# Patient Record
Sex: Male | Born: 1975
Health system: Southern US, Community
[De-identification: ages and names within clinical notes are randomized; demographics above are authoritative.]

## PROBLEM LIST (undated history)

## (undated) DIAGNOSIS — K219 Gastro-esophageal reflux disease without esophagitis: Secondary | ICD-10-CM

## (undated) DIAGNOSIS — T8859XA Other complications of anesthesia, initial encounter: Secondary | ICD-10-CM

## (undated) DIAGNOSIS — G4730 Sleep apnea, unspecified: Secondary | ICD-10-CM

## (undated) DIAGNOSIS — T4145XA Adverse effect of unspecified anesthetic, initial encounter: Secondary | ICD-10-CM

## (undated) HISTORY — PX: TENDON REPAIR: SHX5111

## (undated) HISTORY — PX: OTHER SURGICAL HISTORY: SHX169

## (undated) HISTORY — DX: Gastro-esophageal reflux disease without esophagitis: K21.9

---

## 2002-10-02 ENCOUNTER — Ambulatory Visit (HOSPITAL_BASED_OUTPATIENT_CLINIC_OR_DEPARTMENT_OTHER): Admission: RE | Admit: 2002-10-02 | Discharge: 2002-10-02 | Payer: Self-pay | Admitting: Orthopedic Surgery

## 2004-09-29 ENCOUNTER — Emergency Department (HOSPITAL_COMMUNITY): Admission: EM | Admit: 2004-09-29 | Discharge: 2004-09-29 | Payer: Self-pay | Admitting: Emergency Medicine

## 2004-10-13 ENCOUNTER — Ambulatory Visit (HOSPITAL_COMMUNITY): Admission: RE | Admit: 2004-10-13 | Discharge: 2004-10-13 | Payer: Self-pay | Admitting: Orthopedic Surgery

## 2004-10-13 ENCOUNTER — Ambulatory Visit (HOSPITAL_BASED_OUTPATIENT_CLINIC_OR_DEPARTMENT_OTHER): Admission: RE | Admit: 2004-10-13 | Discharge: 2004-10-13 | Payer: Self-pay | Admitting: Orthopedic Surgery

## 2009-12-22 ENCOUNTER — Ambulatory Visit (HOSPITAL_COMMUNITY): Admission: AD | Admit: 2009-12-22 | Discharge: 2009-12-22 | Payer: Self-pay | Admitting: Gastroenterology

## 2010-07-02 NOTE — Op Note (Signed)
[name], [name]                         ACCOUNT NO.:  [id number]   MEDICAL RECORD NO.:  [id number]                   PATIENT TYPE:  AMB   LOCATION:  DSC                                  FACILITY:  [facility]   PHYSICIAN:  Feliberto Gottron. Turner Daniels, M.D.                DATE OF BIRTH:  November 27, 1975   DATE OF PROCEDURE:  10/02/2002  DATE OF DISCHARGE:                                 OPERATIVE REPORT   PREOPERATIVE DIAGNOSES:  Right knee medial meniscal tear.   POSTOPERATIVE DIAGNOSES:  Right knee medial meniscal tear.   OPERATION PERFORMED:  Right knee partial arthroscopic medial meniscectomy.   SURGEON:  Feliberto Gottron. Turner Daniels, M.D.   ASSISTANTLaural Benes. Jannet Mantis.   ANESTHESIA:  Local converted to general LMA.   ESTIMATED BLOOD LOSS:  Minimal.   FLUIDS REPLACED:  crystalloids.   DRAINS:  None.   TOURNIQUET TIME:  None.   INDICATIONS FOR PROCEDURE:  The patient is a 35 year old man with  symptomatic medial meniscal tear of the right knee, MRI proven, who was  taken for arthroscopic decompression of his right knee to decrease pain and  increase function, having failed conservative treatment with anti-  inflammatory medicines, observation and exercise.   DESCRIPTION OF PROCEDURE:  The patient was identified by arm band and take  to the operating room at [facility].  Appropriate  anesthetic monitors were attached.  Local anesthesia was induced to the  right knee.  Because of persistent pain and anxiety, this was converted to  general LMA anesthesia.  A lateral post was applied to the table and the  right lower extremity prepped and draped in the usual sterile fashion from  the ankle to the midthigh.  Using a #11 blade, standard inferomedial and  inferolateral peripatellar portals were then made allowing introduction of  the arthroscope to the inferolateral portal and the outflow through the  inferomedial portal.  The patella, trochlea and suprapatellar pouch were  in  excellent condition. There were no loose bodies in the joint fluid.  On the  medial compartment, he had a double parrot beak T-type tear, probably a  bucket handle that had torn in the middle and this was debrided back to  stable margins from the posterior horn to the medial horn with a 3.5 gator  sucker shaver. We then thoroughly probed the remaining meniscus and found it  to be stable.  There was a small horizontal cleavage tear that was debrided  back to stable margins as well.  In the notch, the ACL and the PCL were  noted to be intact.  The lateral compartment was pristine.  The gutters were  cleared medially and laterally.  The arthroscope was then inserted medial to  the PCL clearing the posterior compartment as well.  The knee was thoroughly  irrigated with normal saline solution using the arthroscope.  The  instruments  were removed.  A dressing  of Xeroform, 4 x 4 dressing sponges, Webril and Ace wrap was applied.  The  patient was awakened and taken to the recovery room without difficulty.                                                Feliberto Gottron. Turner Daniels, M.D.    Ovid Curd  D:  10/02/2002  T:  10/03/2002  Job:  [id number]

## 2010-07-02 NOTE — Op Note (Signed)
[name], Gregory Lyons             ACCOUNT NO.:  [id number]   MEDICAL RECORD NO.:  [id number]          PATIENT TYPE:  AMB   LOCATION:  DSC                          FACILITY:  [facility]   PHYSICIAN:  Artist Pais. Weingold, M.D.DATE OF BIRTH:  04-11-1975   DATE OF PROCEDURE:  10/13/2004  DATE OF DISCHARGE:                                 OPERATIVE REPORT   PREOPERATIVE DIAGNOSIS:  Left thumb ulnar collateral ligament tear at the  metacarpophalangeal joint.   POSTOPERATIVE DIAGNOSIS:  Left thumb ulnar collateral ligament tear at the  metacarpophalangeal joint.   PROCEDURE:  Repair, left thumb metacarpophalangeal joint ulnar collateral  ligament.   SURGEON:  Artist Pais. Mina Marble, M.D.   ASSISTANT:  Aura Fey. Bobbe Medico.   ANESTHESIA:  General.   TOURNIQUET TIME:  35 minutes.   No complications, no drains.   OPERATIVE REPORT:  The patient was taken to the operating room, where after  the induction of adequate general anesthesia and axillary block analgesia,  the left upper extremity was prepped and draped in the usual sterile  fashion.  An Esmarch was used to exsanguinate the limb.  The tourniquet was  then inflated to 250 mmHg.  At this point in time a C-shaped incision was  made over the ulnar side of the metacarpophalangeal joint of the left thumb  and a large dorsally-based flap was elevated.  The adductor aponeurosis was  longitudinally split and flaps were raised both dorsally and volarly.  The  extensor mechanism was retracted dorsally.  The capsule on the dorsal ulnar  aspect of the metacarpophalangeal joint was incised.  There was a complete  tear of the collateral ligament off the proximal phalangeal base insertion.  The proximal phalangeal base insertion was decorticated down to good  bleeding bone.  The collateral ligament was sutured in a Kessler-type suture  with a 2-0 double-armed Prolene and advanced into the bony trough created at  the base of the proximal phalanx on the  ulnar side using a Keith needle and  pulled over to the radial side through the skin and tied over a well-padded  button.  At this point in time the wound was irrigated.  The capsule was  closed with 2-0 Fibrewire in horizontal mattress sutures, followed by the  aponeurosis with a 4-0 Mersilene, the skin with running 3-0 Prolene  subcuticular stitch.  Steri-Strips, 4 x 4's, fluffs and a radial dorsal  splint was applied.  The patient tolerated the procedure well and __________  in stable fashion.      Artist Pais Mina Marble, M.D.  Electronically Signed     MAW/MEDQ  D:  10/13/2004  T:  10/13/2004  Job:  [id number]

## 2013-05-14 ENCOUNTER — Ambulatory Visit (INDEPENDENT_AMBULATORY_CARE_PROVIDER_SITE_OTHER): Payer: Federal, State, Local not specified - PPO | Admitting: Family Medicine

## 2013-05-14 ENCOUNTER — Encounter: Payer: Self-pay | Admitting: Family Medicine

## 2013-05-14 VITALS — BP 110/78 | HR 74 | Temp 97.5°F | Resp 16 | Ht 72.0 in | Wt 235.0 lb

## 2013-05-14 DIAGNOSIS — R5381 Other malaise: Secondary | ICD-10-CM

## 2013-05-14 DIAGNOSIS — R5383 Other fatigue: Principal | ICD-10-CM

## 2013-05-14 DIAGNOSIS — G4710 Hypersomnia, unspecified: Secondary | ICD-10-CM

## 2013-05-14 LAB — LIPID PANEL
CHOL/HDL RATIO: 4.4 ratio
Cholesterol: 188 mg/dL (ref 0–200)
HDL: 43 mg/dL (ref >39)
LDL CALC: 121 mg/dL — AB (ref 0–99)
Triglycerides: 118 mg/dL (ref <150)
VLDL: 24 mg/dL (ref 0–40)

## 2013-05-14 LAB — COMPLETE METABOLIC PANEL WITH GFR
ALT: 23 U/L (ref 0–53)
AST: 19 U/L (ref 0–37)
Albumin: 4.5 g/dL (ref 3.5–5.2)
Alkaline Phosphatase: 48 U/L (ref 39–117)
BUN: 12 mg/dL (ref 6–23)
CALCIUM: 9 mg/dL (ref 8.4–10.5)
CHLORIDE: 102 meq/L (ref 96–112)
CO2: 29 mEq/L (ref 19–32)
CREATININE: 1.1 mg/dL (ref 0.50–1.35)
GFR, Est African American: >89 mL/min
GFR, Est Non African American: 84 mL/min
Glucose, Bld: 91 mg/dL (ref 70–99)
Potassium: 4.2 mEq/L (ref 3.5–5.3)
SODIUM: 139 meq/L (ref 135–145)
Total Bilirubin: 0.7 mg/dL (ref 0.2–1.2)
Total Protein: 7 g/dL (ref 6.0–8.3)

## 2013-05-14 LAB — CBC WITH DIFFERENTIAL/PLATELET
Basophils Absolute: 0.1 10*3/uL (ref 0.0–0.1)
Basophils Relative: 1 % (ref 0–1)
EOS ABS: 0.4 10*3/uL (ref 0.0–0.7)
EOS PCT: 7 % — AB (ref 0–5)
HCT: 44.9 % (ref 39.0–52.0)
HEMOGLOBIN: 15.6 g/dL (ref 13.0–17.0)
LYMPHS ABS: 2 10*3/uL (ref 0.7–4.0)
Lymphocytes Relative: 33 % (ref 12–46)
MCH: 30.4 pg (ref 26.0–34.0)
MCHC: 34.7 g/dL (ref 30.0–36.0)
MCV: 87.4 fL (ref 78.0–100.0)
MONOS PCT: 9 % (ref 3–12)
Monocytes Absolute: 0.5 10*3/uL (ref 0.1–1.0)
Neutro Abs: 3 10*3/uL (ref 1.7–7.7)
Neutrophils Relative %: 50 % (ref 43–77)
PLATELETS: 222 10*3/uL (ref 150–400)
RBC: 5.14 MIL/uL (ref 4.22–5.81)
RDW: 12.8 % (ref 11.5–15.5)
WBC: 6 10*3/uL (ref 4.0–10.5)

## 2013-05-14 LAB — TSH: TSH: 1.588 u[IU]/mL (ref 0.350–4.500)

## 2013-05-14 NOTE — Progress Notes (Signed)
   Subjective:    Patient ID: Gregory Lyons, male    DOB: 12/25/1975, 38 y.o.   MRN: [id number]  HPI Patient is here today complaining of fatigue and hypersomnia. He can fall asleep easily throughout the day. He feels tired when driving. He denies an asleep when driving. He states that he could easily call asleep after eating, while sitting still, while reading.  He snores loudly every night. His wife has observed apneic episodes.  . Like to be evaluated for sleep apnea. He is also overdue for fasting lab work. No past medical history on file. No current outpatient prescriptions on file prior to visit.   No current facility-administered medications on file prior to visit.   No Known Allergies History   Social History  . Marital Status: Married    Spouse Name: N/A    Number of Children: N/A  . Years of Education: N/A   Occupational History  . Not on file.   Social History Main Topics  . Smoking status: Never Smoker   . Smokeless tobacco: Not on file  . Alcohol Use: Not on file  . Drug Use: Not on file  . Sexual Activity: Not on file   Other Topics Concern  . Not on file   Social History Narrative  . No narrative on file      Review of Systems  All other systems reviewed and are negative.       Objective:   Physical Exam  Vitals reviewed. Neck: Neck supple. No thyromegaly present.  Cardiovascular: Normal rate, regular rhythm and normal heart sounds.   Pulmonary/Chest: Effort normal and breath sounds normal. No respiratory distress. He has no wheezes. He has no rales.  Abdominal: Soft. Bowel sounds are normal. He exhibits no distension. There is no tenderness. There is no rebound and no guarding.  Lymphadenopathy:    He has no cervical adenopathy.    Patient does have a larger neck. He also has +1 tonsils. He has a large uvula      Assessment & Plan:  1. Other malaise and fatigue - COMPLETE METABOLIC PANEL WITH GFR - CBC with Differential - Lipid  panel - TSH  2. Hypersomnia I will schedule patient for a split-level sleep study to evaluate for obstructive sleep apnea.

## 2013-05-15 ENCOUNTER — Other Ambulatory Visit: Payer: Self-pay | Admitting: Family Medicine

## 2013-05-15 DIAGNOSIS — R5381 Other malaise: Secondary | ICD-10-CM

## 2013-05-15 DIAGNOSIS — G4710 Hypersomnia, unspecified: Secondary | ICD-10-CM

## 2013-05-15 DIAGNOSIS — R5383 Other fatigue: Secondary | ICD-10-CM

## 2013-05-28 ENCOUNTER — Ambulatory Visit: Payer: Federal, State, Local not specified - PPO | Attending: Family Medicine | Admitting: Sleep Medicine

## 2013-05-28 ENCOUNTER — Encounter: Payer: Self-pay | Admitting: Neurology

## 2013-05-28 DIAGNOSIS — Z6832 Body mass index (BMI) 32.0-32.9, adult: Secondary | ICD-10-CM | POA: Insufficient documentation

## 2013-05-28 DIAGNOSIS — R5381 Other malaise: Secondary | ICD-10-CM

## 2013-05-28 DIAGNOSIS — R5383 Other fatigue: Secondary | ICD-10-CM

## 2013-05-28 DIAGNOSIS — G4733 Obstructive sleep apnea (adult) (pediatric): Secondary | ICD-10-CM | POA: Insufficient documentation

## 2013-05-28 DIAGNOSIS — G4710 Hypersomnia, unspecified: Secondary | ICD-10-CM

## 2013-05-29 NOTE — Sleep Study (Signed)
Split night protocol not met due to AHI <15 in 1st 2 hrs TST; however in later part of study, past lab cut off time,  severe desaturations noted and overall AHI was >15

## 2013-05-30 ENCOUNTER — Encounter (INDEPENDENT_AMBULATORY_CARE_PROVIDER_SITE_OTHER): Payer: Self-pay

## 2013-05-30 NOTE — Sleep Study (Signed)
  [facility] Dilana Mcphie A. Gerilyn Pilgrimoonquah, MD     [email or web address]          LOCATION: SLEEP LAB FACILITY: [facility]  PHYSICIAN: Caily Rakers A. Gerilyn Pilgrimoonquah, M.D.   DATE OF STUDY: 05/28/2013  NOCTURNAL POLYSOMNOGRAM   REFERRING PHYSICIAN: Lynnea FerrierWarren Pickard.  INDICATIONS: This is a 38 year old man who presents with hypersomnia, fatigue and snoring.  MEDICATIONS:  Prior to Admission medications   Not on File      EPWORTH SLEEPINESS SCALE: 11.   BMI: 32.   ARCHITECTURAL SUMMARY: Total recording time was 361 minutes. Sleep efficiency 93 %. Sleep latency 8 minutes. REM latency 65 minutes. Stage NI 5 %, N2 63 % and N3 2 % and REM sleep 29 %.    RESPIRATORY DATA:  Baseline oxygen saturation is 96 %. The lowest saturation is 61 %. The diagnostic AHI is 29. The RDI is 29. The REM AHI is 50. The obstructive events occurred exclusively during the supine position. The supine index is 62. The events occurred during the last one third of the night and therefore the patient cannot be titrated.  LIMB MOVEMENT SUMMARY: PLM index 43.   ELECTROCARDIOGRAM SUMMARY: Average heart rate is 65 with no significant dysrhythmias observed.   IMPRESSION:  1. Moderately severe supine-related obstructive sleep apnea syndrome. Auto Pap between pressures of 8-20 is suggested. If is not beneficial he may need a formal CPAP titration study. Also consider positional therapy. 2. Severe periodic limb movement disorder of sleep. Consider dopamine agonist such as Mirapex or Requip.  Thanks for this referral.  Keyonte Cookston A. Gerilyn Pilgrimoonquah, M.D. Diplomat, Biomedical engineerAmerican Board of Sleep Medicine.

## 2013-10-14 ENCOUNTER — Other Ambulatory Visit: Payer: Self-pay | Admitting: Family Medicine

## 2013-10-14 DIAGNOSIS — L0291 Cutaneous abscess, unspecified: Secondary | ICD-10-CM

## 2013-10-14 DIAGNOSIS — L0390 Cellulitis, unspecified: Principal | ICD-10-CM

## 2013-10-14 MED ORDER — SULFAMETHOXAZOLE-TMP DS 800-160 MG PO TABS: 1.0000 | ORAL_TABLET | Freq: Two times a day (BID) | ORAL | Status: DC

## 2013-10-18 LAB — CULTURE, ROUTINE-ABSCESS
Gram Stain: NONE SEEN
Organism ID, Bacteria: NO GROWTH

## 2013-12-06 ENCOUNTER — Encounter: Payer: Self-pay | Admitting: Family Medicine

## 2013-12-06 ENCOUNTER — Ambulatory Visit (INDEPENDENT_AMBULATORY_CARE_PROVIDER_SITE_OTHER): Payer: Federal, State, Local not specified - PPO | Admitting: Family Medicine

## 2013-12-06 VITALS — BP 120/80 | HR 72 | Temp 98.7°F | Resp 18

## 2013-12-06 DIAGNOSIS — M25561 Pain in right knee: Secondary | ICD-10-CM
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
## 2013-12-09 ENCOUNTER — Ambulatory Visit: Payer: Federal, State, Local not specified - PPO | Admitting: Family Medicine

## 2013-12-10 ENCOUNTER — Telehealth: Payer: Self-pay | Admitting: *Deleted

## 2013-12-10 DIAGNOSIS — M25562 Pain in left knee: Secondary | ICD-10-CM

## 2013-12-10 NOTE — Telephone Encounter (Signed)
Referral place to ortho

## 2013-12-12 ENCOUNTER — Encounter: Payer: Self-pay | Admitting: *Deleted

## 2013-12-19 ENCOUNTER — Telehealth: Payer: Self-pay | Admitting: *Deleted

## 2013-12-19 MED ORDER — KETOROLAC TROMETHAMINE 10 MG PO TABS
10.0000 mg | ORAL_TABLET | Freq: Four times a day (QID) | ORAL | Status: DC | PRN
Start: 2013-12-19 — End: 2014-03-10

## 2013-12-19 NOTE — Telephone Encounter (Signed)
Received call from patient wife.   Reports that patient continues to have increased pain to R knee.   MD made aware and new orders obtained for Toradol.   Prescription sent to pharmacy.

## 2014-01-08 ENCOUNTER — Ambulatory Visit (HOSPITAL_COMMUNITY)
Admission: RE | Admit: 2014-01-08 | Discharge: 2014-01-08 | Disposition: A | Discharge status: Home / Self Care | Payer: Federal, State, Local not specified - PPO | Source: Ambulatory Visit | Attending: Orthopedic Surgery | Admitting: Orthopedic Surgery

## 2014-01-08 ENCOUNTER — Other Ambulatory Visit (HOSPITAL_COMMUNITY): Payer: Self-pay | Admitting: Orthopedic Surgery

## 2014-01-08 DIAGNOSIS — Z01818 Encounter for other preprocedural examination: Secondary | ICD-10-CM | POA: Insufficient documentation

## 2014-01-08 DIAGNOSIS — M25561 Pain in right knee: Secondary | ICD-10-CM

## 2014-02-25 ENCOUNTER — Ambulatory Visit: Payer: Self-pay | Admitting: Orthopedic Surgery

## 2014-02-25 NOTE — Progress Notes (Signed)
Preoperative surgical orders have been place into the Epic hospital system for Gregory Lyons on 02/25/2014, 2:03 PM  by Patrica DuelPERKINS, ALEXZANDREW for surgery on 03-12-2014.  Preop Knee Scope orders including IV Tylenol and IV Decadron as long as there are no contraindications to the above medications. Avel Peacerew Perkins, PA-C

## 2014-02-26 ENCOUNTER — Encounter (HOSPITAL_COMMUNITY): Payer: Self-pay | Admitting: *Deleted

## 2014-03-10 ENCOUNTER — Encounter (HOSPITAL_COMMUNITY)
Admission: RE | Admit: 2014-03-10 | Discharge: 2014-03-10 | Disposition: A | Discharge status: Home / Self Care | Payer: Federal, State, Local not specified - PPO | Source: Ambulatory Visit | Attending: Orthopedic Surgery | Admitting: Orthopedic Surgery

## 2014-03-10 ENCOUNTER — Encounter (HOSPITAL_COMMUNITY): Payer: Self-pay

## 2014-03-10 DIAGNOSIS — S83241A Other tear of medial meniscus, current injury, right knee, initial encounter: Secondary | ICD-10-CM | POA: Diagnosis not present

## 2014-03-10 DIAGNOSIS — Y929 Unspecified place or not applicable: Secondary | ICD-10-CM | POA: Diagnosis not present

## 2014-03-10 DIAGNOSIS — Z6832 Body mass index (BMI) 32.0-32.9, adult: Secondary | ICD-10-CM | POA: Diagnosis not present

## 2014-03-10 DIAGNOSIS — Y999 Unspecified external cause status: Secondary | ICD-10-CM | POA: Diagnosis not present

## 2014-03-10 DIAGNOSIS — X58XXXA Exposure to other specified factors, initial encounter: Secondary | ICD-10-CM | POA: Diagnosis not present

## 2014-03-10 DIAGNOSIS — Z9889 Other specified postprocedural states: Secondary | ICD-10-CM | POA: Diagnosis not present

## 2014-03-10 DIAGNOSIS — E669 Obesity, unspecified: Secondary | ICD-10-CM | POA: Diagnosis not present

## 2014-03-10 DIAGNOSIS — Z87891 Personal history of nicotine dependence: Secondary | ICD-10-CM | POA: Diagnosis not present

## 2014-03-10 DIAGNOSIS — G4730 Sleep apnea, unspecified: Secondary | ICD-10-CM | POA: Diagnosis not present

## 2014-03-10 DIAGNOSIS — Y939 Activity, unspecified: Secondary | ICD-10-CM | POA: Diagnosis not present

## 2014-03-10 HISTORY — DX: Adverse effect of unspecified anesthetic, initial encounter: T41.45XA

## 2014-03-10 HISTORY — DX: Other complications of anesthesia, initial encounter: T88.59XA

## 2014-03-10 LAB — CBC
HCT: 46.1 % (ref 39.0–52.0)
HEMOGLOBIN: 15.8 g/dL (ref 13.0–17.0)
MCH: 30.9 pg (ref 26.0–34.0)
MCHC: 34.3 g/dL (ref 30.0–36.0)
MCV: 90.2 fL (ref 78.0–100.0)
PLATELETS: 210 10*3/uL (ref 150–400)
RBC: 5.11 MIL/uL (ref 4.22–5.81)
RDW: 12.1 % (ref 11.5–15.5)
WBC: 10.4 10*3/uL (ref 4.0–10.5)

## 2014-03-10 LAB — BASIC METABOLIC PANEL
Anion gap: 7 (ref 5–15)
BUN: 17 mg/dL (ref 6–23)
CALCIUM: 9.6 mg/dL (ref 8.4–10.5)
CHLORIDE: 108 mmol/L (ref 96–112)
CO2: 28 mmol/L (ref 19–32)
CREATININE: 1.11 mg/dL (ref 0.50–1.35)
GFR, EST NON AFRICAN AMERICAN: 81 mL/min — AB (ref >90)
Glucose, Bld: 109 mg/dL — ABNORMAL HIGH (ref 70–99)
POTASSIUM: 4.8 mmol/L (ref 3.5–5.1)
Sodium: 143 mmol/L (ref 135–145)

## 2014-03-10 NOTE — Patient Instructions (Signed)
20 Bevely PalmerBenjamin A Manuelito  03/10/2014   Your procedure is scheduled on:   Wednesday, March 12, 2014   [facility] and follow signs to  Short Stay Center arrive at 11:30 AM.   Call this number if you have problems the morning of surgery [phone] or Presurgical Testing [phone].   Remember:  Do not eat food After Midnight, may take clear liquids till 7:30 am then nothing by mouth day of surgery.      Take these medicines the morning of surgery with A SIP OF WATER: NONE                               You may not have any metal on your body including hair pins and piercings  Do not wear jewelry, lotions, powders, cologne or deodorant.  Men may shave face and neck.               Do not bring valuables to the hospital. [facility] IS NOT RESPONSIBLE FOR VALUABLES.  Contacts, dentures or bridgework may not be worn into surgery.   Patients discharged the day of surgery will not be allowed to drive home.  Name and phone number of your driver:Sandy Anne HahnWillis (wife)   Special Instructions: review fact sheets for: Incentive Spirometry.   ________________________________________________________________________  [facility] - Preparing [facility] Before surgery, you can play an important role.  Because skin is not sterile, your skin needs to be as free of germs as possible.  You can reduce the number of germs on your skin by washing with CHG (chlorahexidine gluconate) soap before surgery.  CHG is an antiseptic cleaner which kills germs and bonds with the skin to continue killing germs even after washing. Please DO NOT use if you have an allergy to CHG or antibacterial soaps.  If your skin becomes reddened/irritated stop using the CHG and inform your nurse when you arrive at Short Stay. Do not shave (including legs and underarms) for at least 48 hours prior to the first CHG shower.  You may shave your face/neck. Please follow these instructions carefully:  1.   Shower with CHG Soap the night before surgery and the  morning of Surgery.  2.  If you choose to wash your hair, wash your hair first as usual with your  normal  shampoo.  3.  After you shampoo, rinse your hair and body thoroughly to remove the  shampoo.                           4.  Use CHG as you would any other liquid soap.  You can apply chg directly  to the skin and wash                       Gently with a scrungie or clean washcloth.  5.  Apply the CHG Soap to your body ONLY FROM THE NECK DOWN.   Do not use on face/ open                           Wound or open sores. Avoid contact with eyes, ears mouth and genitals (private parts).                       Wash face,  Medical illustratorGenitals (private parts)  with your normal soap.             6.  Wash thoroughly, paying special attention to the area where your surgery  will be performed.  7.  Thoroughly rinse your body with warm water from the neck down.  8.  DO NOT shower/wash with your normal soap after using and rinsing off  the CHG Soap.                9.  Pat yourself dry with a clean towel.            10.  Wear clean pajamas.            11.  Place clean sheets on your bed the night of your first shower and do not  sleep with pets. Day of Surgery : Do not apply any lotions/deodorants the morning of surgery.  Please wear clean clothes to the hospital/surgery center.  FAILURE TO FOLLOW THESE INSTRUCTIONS MAY RESULT IN THE CANCELLATION OF YOUR SURGERY PATIENT SIGNATURE_________________________________  NURSE SIGNATURE__________________________________  ________________________________________________________________________   Rogelia Mire  An incentive spirometer is a tool that can help keep your lungs clear and active. This tool measures how well you are filling your lungs with each breath. Taking long deep breaths may help reverse or decrease the chance of developing breathing (pulmonary) problems (especially infection) following:  A long  period of time when you are unable to move or be active. BEFORE THE PROCEDURE   If the spirometer includes an indicator to show your best effort, your nurse or respiratory therapist will set it to a desired goal.  If possible, sit up straight or lean slightly forward. Try not to slouch.  Hold the incentive spirometer in an upright position. INSTRUCTIONS FOR USE  1. Sit on the edge of your bed if possible, or sit up as far as you can in bed or on a chair. 2. Hold the incentive spirometer in an upright position. 3. Breathe out normally. 4. Place the mouthpiece in your mouth and seal your lips tightly around it. 5. Breathe in slowly and as deeply as possible, raising the piston or the ball toward the top of the column. 6. Hold your breath for 3-5 seconds or for as long as possible. Allow the piston or ball to fall to the bottom of the column. 7. Remove the mouthpiece from your mouth and breathe out normally. 8. Rest for a few seconds and repeat Steps 1 through 7 at least 10 times every 1-2 hours when you are awake. Take your time and take a few normal breaths between deep breaths. 9. The spirometer may include an indicator to show your best effort. Use the indicator as a goal to work toward during each repetition. 10. After each set of 10 deep breaths, practice coughing to be sure your lungs are clear. If you have an incision (the cut made at the time of surgery), support your incision when coughing by placing a pillow or rolled up towels firmly against it. Once you are able to get out of bed, walk around indoors and cough well. You may stop using the incentive spirometer when instructed by your caregiver.  RISKS AND COMPLICATIONS  Take your time so you do not get dizzy or light-headed.  If you are in pain, you may need to take or ask for pain medication before doing incentive spirometry. It is harder to take a deep breath if you are having pain. AFTER USE  Rest and breathe  slowly and  easily.  It can be helpful to keep track of a log of your progress. Your caregiver can provide you with a simple table to help with this. If you are using the spirometer at home, follow these instructions: SEEK MEDICAL CARE IF:   You are having difficultly using the spirometer.  You have trouble using the spirometer as often as instructed.  Your pain medication is not giving enough relief while using the spirometer.  You develop fever of 100.5 F (38.1 C) or higher. SEEK IMMEDIATE MEDICAL CARE IF:   You cough up bloody sputum that had not been present before.  You develop fever of 102 F (38.9 C) or greater.  You develop worsening pain at or near the incision site. MAKE SURE YOU:   Understand these instructions.  Will watch your condition.  Will get help right away if you are not doing well or get worse. Document Released: 06/13/2006 Document Revised: 04/25/2011 Document Reviewed: 08/14/2006 ExitCare Patient Information 2014 ExitCare, [place].   ________________________________________________________________________    CLEAR LIQUID DIET   Foods Allowed                                                                     Foods Excluded  Coffee and tea, regular and decaf                             liquids that you cannot  Plain Jell-O in any flavor                                             see through such as: Fruit ices (not with fruit pulp)                                     milk, soups, orange juice  Iced Popsicles                                    All solid food Carbonated beverages, regular and diet                                    Cranberry, grape and apple juices Sports drinks like Gatorade Lightly seasoned clear broth or consume(fat free) Sugar, honey syrup  Sample Menu Breakfast                                Lunch                                     Supper Cranberry juice                    Beef broth  Chicken broth Jell-O                                      Grape juice                           Apple juice Coffee or tea                        Jell-O                                      Popsicle                                                Coffee or tea                        Coffee or tea  _____________________________________________________________________

## 2014-03-10 NOTE — Progress Notes (Signed)
Your patient has screened at an elevated risk for Obstructive Sleep Apnea using the Stop-Bang Tool during a pre-surgical vist. A score of 4 or greater is an elevated risk. Score of 5.  

## 2014-03-10 NOTE — Progress Notes (Addendum)
LOV note epic Dr Tanya NonesPickard 12/06/2013  Sleep Study 05/28/2013 [facility] Dr Filbert Bertholdoonguah pt states has not obtained CPAP at present time

## 2014-03-11 DIAGNOSIS — S83249A Other tear of medial meniscus, current injury, unspecified knee, initial encounter: Secondary | ICD-10-CM | POA: Diagnosis present

## 2014-03-11 NOTE — H&P (Signed)
  CC- Gregory Lyons is a 39 y.o. male who presents with right knee pain.  HPI- . Knee Pain: Patient presents with knee pain involving the  right knee. Onset of the symptoms was several months ago. Inciting event: none known. Current symptoms include giving out, pain located medially and stiffness. Pain is aggravated by pivoting, rising after sitting, squatting and walking.  Patient has had prior knee problems. Evaluation to date: MRI: abnormal medial meniscal tear. Treatment to date: corticosteroid injection which was not very effective.  Past Medical History  Diagnosis Date  . Complication of anesthesia     pt states gets mean   . Sleep apnea     pt states does not use CPAP pt scored 5 on stop bang tool results sent to PCP     Past Surgical History  Procedure Laterality Date  . Right knee arthroscopy      12 to 13 years ago   . Tendon repair      left hand 2007     Prior to Admission medications   Not on File   KNEE EXAM antalgic gait, soft tissue tenderness over medial joint line, no effusion, negative drawer sign, collateral ligaments intact  Physical Examination: General appearance - alert, well appearing, and in no distress Mental status - alert, oriented to person, place, and time Chest - clear to auscultation, no wheezes, rales or rhonchi, symmetric air entry Heart - normal rate, regular rhythm, normal S1, S2, no murmurs, rubs, clicks or gallops Abdomen - soft, nontender, nondistended, no masses or organomegaly Neurological - alert, oriented, normal speech, no focal findings or movement disorder noted   Asessment/Plan--- Rightt knee medial meniscal tear- - Plan right knee arthroscopy with meniscal debridement. Procedure risks and potential comps discussed with patient who elects to proceed. Goals are decreased pain and increased function with a high likelihood of achieving both

## 2014-03-12 ENCOUNTER — Ambulatory Visit (HOSPITAL_COMMUNITY): Payer: Federal, State, Local not specified - PPO | Admitting: Registered Nurse

## 2014-03-12 ENCOUNTER — Encounter (HOSPITAL_COMMUNITY)
Admission: RE | Disposition: A | Discharge status: Home / Self Care | Payer: Self-pay | Source: Ambulatory Visit | Attending: Orthopedic Surgery

## 2014-03-12 ENCOUNTER — Encounter (HOSPITAL_COMMUNITY): Payer: Self-pay | Admitting: *Deleted

## 2014-03-12 ENCOUNTER — Ambulatory Visit (HOSPITAL_COMMUNITY)
Admission: RE | Admit: 2014-03-12 | Discharge: 2014-03-12 | Disposition: A | Discharge status: Home / Self Care | Payer: Federal, State, Local not specified - PPO | Source: Ambulatory Visit | Attending: Orthopedic Surgery | Admitting: Orthopedic Surgery

## 2014-03-12 DIAGNOSIS — Z9889 Other specified postprocedural states: Secondary | ICD-10-CM | POA: Insufficient documentation

## 2014-03-12 DIAGNOSIS — G4730 Sleep apnea, unspecified: Secondary | ICD-10-CM | POA: Insufficient documentation

## 2014-03-12 DIAGNOSIS — X58XXXA Exposure to other specified factors, initial encounter: Secondary | ICD-10-CM | POA: Insufficient documentation

## 2014-03-12 DIAGNOSIS — Y929 Unspecified place or not applicable: Secondary | ICD-10-CM | POA: Insufficient documentation

## 2014-03-12 DIAGNOSIS — S83249A Other tear of medial meniscus, current injury, unspecified knee, initial encounter: Secondary | ICD-10-CM | POA: Diagnosis present

## 2014-03-12 DIAGNOSIS — S83241A Other tear of medial meniscus, current injury, right knee, initial encounter: Secondary | ICD-10-CM | POA: Diagnosis not present

## 2014-03-12 DIAGNOSIS — Y999 Unspecified external cause status: Secondary | ICD-10-CM | POA: Insufficient documentation

## 2014-03-12 DIAGNOSIS — E669 Obesity, unspecified: Secondary | ICD-10-CM | POA: Insufficient documentation

## 2014-03-12 DIAGNOSIS — Z6832 Body mass index (BMI) 32.0-32.9, adult: Secondary | ICD-10-CM | POA: Insufficient documentation

## 2014-03-12 DIAGNOSIS — Z87891 Personal history of nicotine dependence: Secondary | ICD-10-CM | POA: Insufficient documentation

## 2014-03-12 DIAGNOSIS — Y939 Activity, unspecified: Secondary | ICD-10-CM | POA: Insufficient documentation

## 2014-03-12 HISTORY — PX: KNEE ARTHROSCOPY: SHX127

## 2014-03-12 HISTORY — DX: Sleep apnea, unspecified: G47.30

## 2014-03-12 SURGERY — ARTHROSCOPY, KNEE
Anesthesia: General | Site: Knee | Laterality: Right

## 2014-03-12 MED ORDER — KETOROLAC TROMETHAMINE 30 MG/ML IJ SOLN
INTRAMUSCULAR | Status: AC
  Filled 2014-03-12: qty 1

## 2014-03-12 MED ORDER — CHLORHEXIDINE GLUCONATE 4 % EX LIQD: 60.0000 mL | Freq: Once | CUTANEOUS | Status: DC

## 2014-03-12 MED ORDER — DEXAMETHASONE SODIUM PHOSPHATE 10 MG/ML IJ SOLN
INTRAMUSCULAR | Status: AC
  Filled 2014-03-12: qty 1

## 2014-03-12 MED ORDER — KETOROLAC TROMETHAMINE 30 MG/ML IJ SOLN
INTRAMUSCULAR | Status: DC | PRN
  Administered 2014-03-12: 30 mg via INTRAVENOUS

## 2014-03-12 MED ORDER — DEXAMETHASONE SODIUM PHOSPHATE 10 MG/ML IJ SOLN: 10.0000 mg | Freq: Once | INTRAMUSCULAR | Status: DC

## 2014-03-12 MED ORDER — HYDROCODONE-ACETAMINOPHEN 5-325 MG PO TABS: 1.0000 | ORAL_TABLET | Freq: Four times a day (QID) | ORAL | Status: DC | PRN

## 2014-03-12 MED ORDER — LIDOCAINE HCL (CARDIAC) 20 MG/ML IV SOLN
INTRAVENOUS | Status: AC
  Filled 2014-03-12: qty 5

## 2014-03-12 MED ORDER — FENTANYL CITRATE 0.05 MG/ML IJ SOLN: 25.0000 ug | INTRAMUSCULAR | Status: DC | PRN

## 2014-03-12 MED ORDER — OXYCODONE-ACETAMINOPHEN 5-325 MG PO TABS: 1.0000 | ORAL_TABLET | Freq: Four times a day (QID) | ORAL | Status: DC | PRN

## 2014-03-12 MED ORDER — PROPOFOL 10 MG/ML IV BOLUS
INTRAVENOUS | Status: DC | PRN
  Administered 2014-03-12: 200 mg via INTRAVENOUS

## 2014-03-12 MED ORDER — LACTATED RINGERS IV SOLN
INTRAVENOUS | Status: DC
  Administered 2014-03-12: 1000 mL via INTRAVENOUS

## 2014-03-12 MED ORDER — PROPOFOL 10 MG/ML IV BOLUS
INTRAVENOUS | Status: AC
  Filled 2014-03-12: qty 20

## 2014-03-12 MED ORDER — LACTATED RINGERS IR SOLN
Status: DC | PRN
  Administered 2014-03-12: 3000 mL

## 2014-03-12 MED ORDER — ACETAMINOPHEN 10 MG/ML IV SOLN
1000.0000 mg | Freq: Once | INTRAVENOUS | Status: AC
  Administered 2014-03-12: 1000 mg via INTRAVENOUS
  Filled 2014-03-12: qty 100

## 2014-03-12 MED ORDER — FENTANYL CITRATE 0.05 MG/ML IJ SOLN
INTRAMUSCULAR | Status: AC
  Filled 2014-03-12: qty 5

## 2014-03-12 MED ORDER — LIDOCAINE HCL (CARDIAC) 20 MG/ML IV SOLN
INTRAVENOUS | Status: DC | PRN
  Administered 2014-03-12: 100 mg via INTRAVENOUS

## 2014-03-12 MED ORDER — METHOCARBAMOL 500 MG PO TABS: 500.0000 mg | ORAL_TABLET | Freq: Four times a day (QID) | ORAL | Status: DC

## 2014-03-12 MED ORDER — PROMETHAZINE HCL 25 MG/ML IJ SOLN: 6.2500 mg | INTRAMUSCULAR | Status: DC | PRN

## 2014-03-12 MED ORDER — BUPIVACAINE-EPINEPHRINE 0.25% -1:200000 IJ SOLN
INTRAMUSCULAR | Status: DC | PRN
  Administered 2014-03-12: 20 mL

## 2014-03-12 MED ORDER — FENTANYL CITRATE 0.05 MG/ML IJ SOLN
INTRAMUSCULAR | Status: DC | PRN
  Administered 2014-03-12 (×2): 50 ug via INTRAVENOUS

## 2014-03-12 MED ORDER — ONDANSETRON HCL 4 MG/2ML IJ SOLN
INTRAMUSCULAR | Status: DC | PRN
  Administered 2014-03-12: 4 mg via INTRAVENOUS

## 2014-03-12 MED ORDER — CEFAZOLIN SODIUM-DEXTROSE 2-3 GM-% IV SOLR
2.0000 g | INTRAVENOUS | Status: AC
  Administered 2014-03-12: 2 g via INTRAVENOUS

## 2014-03-12 MED ORDER — BUPIVACAINE-EPINEPHRINE (PF) 0.25% -1:200000 IJ SOLN
INTRAMUSCULAR | Status: AC
  Filled 2014-03-12: qty 30

## 2014-03-12 MED ORDER — ONDANSETRON HCL 4 MG/2ML IJ SOLN
INTRAMUSCULAR | Status: AC
  Filled 2014-03-12: qty 2

## 2014-03-12 MED ORDER — MIDAZOLAM HCL 5 MG/5ML IJ SOLN
INTRAMUSCULAR | Status: DC | PRN
  Administered 2014-03-12: 2 mg via INTRAVENOUS

## 2014-03-12 MED ORDER — MIDAZOLAM HCL 2 MG/2ML IJ SOLN
INTRAMUSCULAR | Status: AC
  Filled 2014-03-12: qty 2

## 2014-03-12 MED ORDER — CEFAZOLIN SODIUM-DEXTROSE 2-3 GM-% IV SOLR
INTRAVENOUS | Status: AC
  Filled 2014-03-12: qty 50

## 2014-03-12 MED ORDER — SODIUM CHLORIDE 0.9 % IV SOLN: INTRAVENOUS | Status: DC

## 2014-03-12 MED ORDER — DEXAMETHASONE SODIUM PHOSPHATE 10 MG/ML IJ SOLN
INTRAMUSCULAR | Status: DC | PRN
  Administered 2014-03-12: 10 mg via INTRAVENOUS

## 2014-03-12 SURGICAL SUPPLY — 27 items
BANDAGE ELASTIC 6 VELCRO ST LF (GAUZE/BANDAGES/DRESSINGS) ×2 IMPLANT
BLADE 4.2CUDA (BLADE) ×2 IMPLANT
BLADE SURG SZ11 CARB STEEL (BLADE) IMPLANT
CUFF TOURN SGL QUICK 34 (TOURNIQUET CUFF) ×2
CUFF TRNQT CYL 34X4X40X1 (TOURNIQUET CUFF) ×1 IMPLANT
DRAPE U-SHAPE 47X51 STRL (DRAPES) ×2 IMPLANT
DRSG EMULSION OIL 3X3 NADH (GAUZE/BANDAGES/DRESSINGS) ×2 IMPLANT
DRSG PAD ABDOMINAL 8X10 ST (GAUZE/BANDAGES/DRESSINGS) ×2 IMPLANT
DURAPREP 26ML APPLICATOR (WOUND CARE) ×2 IMPLANT
GAUZE SPONGE 4X4 12PLY STRL (GAUZE/BANDAGES/DRESSINGS) ×2 IMPLANT
GLOVE BIO SURGEON STRL SZ8 (GLOVE) ×2 IMPLANT
GLOVE BIOGEL PI IND STRL 8 (GLOVE) ×1 IMPLANT
GLOVE BIOGEL PI INDICATOR 8 (GLOVE) ×1
GOWN STRL REUS W/TWL LRG LVL3 (GOWN DISPOSABLE) ×2 IMPLANT
KIT BASIN OR (CUSTOM PROCEDURE TRAY) ×2 IMPLANT
MANIFOLD NEPTUNE II (INSTRUMENTS) ×2 IMPLANT
PACK ARTHROSCOPY WL (CUSTOM PROCEDURE TRAY) ×2 IMPLANT
PACK ICE MAXI GEL EZY WRAP (MISCELLANEOUS) ×6 IMPLANT
PAD ABD 8X10 STRL (GAUZE/BANDAGES/DRESSINGS) ×1 IMPLANT
PADDING CAST COTTON 6X4 STRL (CAST SUPPLIES) ×3 IMPLANT
POSITIONER SURGICAL ARM (MISCELLANEOUS) ×2 IMPLANT
SET ARTHROSCOPY TUBING (MISCELLANEOUS) ×2
SET ARTHROSCOPY TUBING LN (MISCELLANEOUS) ×1 IMPLANT
SUT ETHILON 4 0 PS 2 18 (SUTURE) ×2 IMPLANT
TOWEL OR 17X26 10 PK STRL BLUE (TOWEL DISPOSABLE) ×2 IMPLANT
WAND 90 DEG TURBOVAC W/CORD (SURGICAL WAND) IMPLANT
WRAP KNEE MAXI GEL POST OP (GAUZE/BANDAGES/DRESSINGS) ×2 IMPLANT

## 2014-03-12 NOTE — Op Note (Signed)
Preoperative diagnosis-  Right knee medial meniscal tear  Postoperative diagnosis Right- knee medial meniscal tear plus  Medial femoral chondral defect  Procedure- Right knee arthroscopy with medial  meniscal debridement and chondroplasty   Surgeon- Gus RankinFrank V. Mayco Walrond, MD  Anesthesia-General  EBL-  Minimal  Complications- None  Condition- PACU - hemodynamically stable.  Brief clinical note- -Gregory Lyons is a 39 y.o.  male with a several month history of right knee pain and mechanical symptoms. Exam and history suggested medial meniscal tear confirmed by MRI. The patient presents now for arthroscopy and debridement   Procedure in detail -       After successful administration of General anesthetic, a tourmiquet is placed high on the Right  thigh and the Right lower extremity is prepped and draped in the usual sterile fashion. Time out is performed by the surgical team. Standard superomedial and inferolateral portal sites are marked and incisions made with an 11 blade. The inflow cannula is passed through the superomedial portal and camera through the inferolateral portal and inflow is initiated. Arthroscopic visualization proceeds.      The undersurface of the patella and trochlea are visualized and they are normal. The medial and lateral gutters are visualized and there are  no loose bodies. Flexion and valgus force is applied to the knee and the medial compartment is entered. A spinal needle is passed into the joint through the site marked for the inferomedial portal. A small incision is made and the dilator passed into the joint. The findings for the medial compartment are unstable displaced medial meniscal tear with medial femoral chondral defect 1 x 1 cm . The tear is debrided to a stable base with baskets and a shaver and sealed off with the Arthrocare. The shaver is used to debride the unstable cartilage to a stable cartilaginous  base with stable edges. It is probed and found to be  stable.    The intercondylar notch is visualized and the ACL appears normal. The lateral compartment is entered and the findings are normal .      The joint is again inspected and there are no other tears, defects or loose bodies identified. The arthroscopic equipment is then removed from the inferior portals which are closed with interrupted 4-0 nylon. 20 ml of .25% Marcaine with epinephrine are injected through the inflow cannula and the cannula is then removed and the portal closed with nylon. The incisions are cleaned and dried and a bulky sterile dressing is applied. The patient is then awakened and transported to recovery in stable condition.   03/12/2014, 2:03 PM

## 2014-03-12 NOTE — Anesthesia Postprocedure Evaluation (Signed)
  Anesthesia Post-op Note  Patient: Gregory Lyons  Procedure(s) Performed: Procedure(s) (LRB): ARTHROSCOPY RIGHT KNEE WITH MENISCAL DEBRIDEMENT condroplasty (Right)  Patient Location: PACU  Anesthesia Type: General  Level of Consciousness: awake and alert   Airway and Oxygen Therapy: Patient Spontanous Breathing  Post-op Pain: mild  Post-op Assessment: Post-op Vital signs reviewed, Patient's Cardiovascular Status Stable, Respiratory Function Stable, Patent Airway and No signs of Nausea or vomiting  Last Vitals:  Filed Vitals:   03/12/14 1528  BP: 141/83  Pulse: 84  Temp:   Resp: 20    Post-op Vital Signs: stable   Complications: No apparent anesthesia complications

## 2014-03-12 NOTE — Anesthesia Procedure Notes (Signed)
Procedure Name: LMA Insertion Date/Time: 03/12/2014 1:28 PM Performed by: Jarvis NewcomerARMISTEAD, Luvenia Cranford A Pre-anesthesia Checklist: Patient identified, Timeout performed, Emergency Drugs available, Suction available and Patient being monitored Patient Re-evaluated:Patient Re-evaluated prior to inductionOxygen Delivery Method: Circle system utilized Preoxygenation: Pre-oxygenation with 100% oxygen Intubation Type: IV induction Ventilation: Mask ventilation without difficulty LMA: LMA with gastric port inserted LMA Size: 4.0 Number of attempts: 1 Tube secured with: Tape Dental Injury: Teeth and Oropharynx as per pre-operative assessment

## 2014-03-12 NOTE — Anesthesia Preprocedure Evaluation (Signed)
Anesthesia Evaluation  Patient identified by MRN, date of birth, ID band Patient awake    Reviewed: Allergy & Precautions, NPO status , Patient's Chart, lab work & pertinent test results  History of Anesthesia Complications (+) history of anesthetic complications  Airway Mallampati: II  TM Distance: >3 FB Neck ROM: Full    Dental no notable dental hx.    Pulmonary sleep apnea , former smoker,  breath sounds clear to auscultation  Pulmonary exam normal       Cardiovascular negative cardio ROS  Rhythm:Regular Rate:Normal     Neuro/Psych negative neurological ROS  negative psych ROS   GI/Hepatic negative GI ROS, Neg liver ROS,   Endo/Other  negative endocrine ROS  Renal/GU negative Renal ROS  negative genitourinary   Musculoskeletal negative musculoskeletal ROS (+)   Abdominal (+) + obese,   Peds negative pediatric ROS (+)  Hematology negative hematology ROS (+)   Anesthesia Other Findings   Reproductive/Obstetrics negative OB ROS                             Anesthesia Physical Anesthesia Plan  ASA: II  Anesthesia Plan: General   Post-op Pain Management:    Induction: Intravenous  Airway Management Planned: LMA  Additional Equipment:   Intra-op Plan:   Post-operative Plan: Extubation in OR  Informed Consent: I have reviewed the patients History and Physical, chart, labs and discussed the procedure including the risks, benefits and alternatives for the proposed anesthesia with the patient or authorized representative who has indicated his/her understanding and acceptance.   Dental advisory given  Plan Discussed with: CRNA  Anesthesia Plan Comments:         Anesthesia Quick Evaluation

## 2014-03-12 NOTE — Interval H&P Note (Signed)
History and Physical Interval Note:  03/12/2014 1:16 PM  Gregory Lyons  has presented today for surgery, with the diagnosis of right knee medial meniscal tear  The various methods of treatment have been discussed with the patient and family. After consideration of risks, benefits and other options for treatment, the patient has consented to  Procedure(s): ARTHROSCOPY RIGHT KNEE WITH MENISCAL DEBRIDEMENT (Right) as a surgical intervention .  The patient's history has been reviewed, patient examined, no change in status, stable for surgery.  I have reviewed the patient's chart and labs.  Questions were answered to the patient's satisfaction.     Loanne DrillingALUISIO,Lean Fayson V

## 2014-03-12 NOTE — Transfer of Care (Signed)
Immediate Anesthesia Transfer of Care Note  Patient: Gregory Lyons  Procedure(s) Performed: Procedure(s): ARTHROSCOPY RIGHT KNEE WITH MENISCAL DEBRIDEMENT condroplasty (Right)  Patient Location: PACU  Anesthesia Type:General  Level of Consciousness: awake, alert  and oriented  Airway & Oxygen Therapy: Patient Spontanous Breathing and Patient connected to nasal cannula oxygen  Post-op Assessment: Report given to PACU RN and Post -op Vital signs reviewed and stable  Post vital signs: Reviewed and stable  Complications: No apparent anesthesia complications

## 2014-03-12 NOTE — Discharge Instructions (Signed)
Arthroscopic Procedure, Knee °An arthroscopic procedure can find what is wrong with your knee. °PROCEDURE °Arthroscopy is a surgical technique that allows your orthopedic surgeon to diagnose and treat your knee injury with accuracy. They will look into your knee through a small instrument. This is almost like a small (pencil sized) telescope. Because arthroscopy affects your knee less than open knee surgery, you can anticipate a more rapid recovery. Taking an active role by following your caregiver's instructions will help with rapid and complete recovery. Use crutches, rest, elevation, ice, and knee exercises as instructed. The length of recovery depends on various factors including type of injury, age, physical condition, medical conditions, and your rehabilitation. °Your knee is the joint between the large bones (femur and tibia) in your leg. Cartilage covers these bone ends which are smooth and slippery and allow your knee to bend and move smoothly. Two menisci, thick, semi-lunar shaped pads of cartilage which form a rim inside the joint, help absorb shock and stabilize your knee. Ligaments bind the bones together and support your knee joint. Muscles move the joint, help support your knee, and take stress off the joint itself. Because of this all programs and physical therapy to rehabilitate an injured or repaired knee require rebuilding and strengthening your muscles. °AFTER THE PROCEDURE °· After the procedure, you will be moved to a recovery area until most of the effects of the medication have worn off. Your caregiver will discuss the test results with you.  °· Only take over-the-counter or prescription medicines for pain, discomfort, or fever as directed by your caregiver.  °SEEK MEDICAL CARE IF:  °· You have increased bleeding from your wounds.  °· You see redness, swelling, or have increasing pain in your wounds.  °· You have pus coming from your wound.  °· You have an oral temperature above 102° F (38.9°  C).  °· You notice a bad smell coming from the wound or dressing.  °· You have severe pain with any motion of your knee.  °SEEK IMMEDIATE MEDICAL CARE IF:  °· You develop a rash.  °· You have difficulty breathing.  °· You have any allergic problems.  °FURTHER INSTRUCTIONS: °· You may start showering two days after being discharged home but do not submerge the incisions under water.  °· Change dressing 48 hours after the procedure and then cover the small incisions with band aids until your follow up visit. °· Avoid periods of inactivity such as sitting longer than an hour when not asleep. This helps prevent blood clots.  °· You may put full weight on your legs and walk as much as is comfortable.  °· Do not drive while taking narcotics.  °Wear the elastic stockings for three weeks following surgery during the day but you may remove then at night. °· Make sure you keep all of your appointments after your operation with all of your doctors and caregivers. You should call the office at [phone] and make an appointment for approximately one week after the date of your surgery. °· Please pick up a stool softener and laxative for home use as long as you are requiring pain medications. °· ICE to the affected knee every three hours for 30 minutes at a time and then as needed for pain and swelling.  Continue to use ice on the knee for pain and swelling from surgery. You may notice swelling that will progress down to the foot and ankle.  This is normal after surgery.  Elevate the   leg when you are not up walking on it.   °RANGE OF MOTION AND STRENGTHENING EXERCISES  °Rehabilitation of the knee is important following a knee injury or an operation. After just a few days of immobilization, the muscles of the thigh which control the knee become weakened and shrink (atrophy). Knee exercises are designed to build up the tone and strength of the thigh muscles and to improve knee motion. Often times heat used for twenty to thirty  minutes before working out will loosen up your tissues and help with improving the range of motion but do not use heat for the first two weeks following surgery. These exercises can be done on a training (exercise) mat, on the floor, on a table or on a bed. Use what ever works the best and is most comfortable for you Knee exercises include: ° ° ° ° ° ° °QUAD STRENGTHENING EXERCISES °Strengthening Quadriceps Sets ° °Tighten muscles on top of thigh by pushing knees down into floor or table. °Hold for 20 seconds. Repeat 10 times. °Do 2 sessions per day. ° ° ° °Strengthening Terminal Knee Extension ° °With knee bent over bolster, straighten knee by tightening muscle on top of thigh. Be sure to keep bottom of knee on bolster. °Hold for 20 seconds. Repeat 10 times. °Do 2 sessions per day. ° ° °Straight Leg with Bent Knee ° °Lie on back with opposite leg bent. Keep involved knee slightly bent at knee and raise leg 4-6". Hold for 10 seconds. °Repeat 20 times per set. °Do 2 sets per session. °Do 2 sessions per day. ° °

## 2014-03-13 ENCOUNTER — Encounter (HOSPITAL_COMMUNITY): Payer: Self-pay | Admitting: Orthopedic Surgery

## 2015-03-12 ENCOUNTER — Other Ambulatory Visit: Payer: Self-pay | Admitting: *Deleted

## 2015-03-12 MED ORDER — AMOXICILLIN-POT CLAVULANATE 875-125 MG PO TABS: 1.0000 | ORAL_TABLET | Freq: Two times a day (BID) | ORAL | Status: DC

## 2015-03-13 ENCOUNTER — Encounter: Payer: Self-pay | Admitting: *Deleted

## 2015-06-08 ENCOUNTER — Ambulatory Visit (INDEPENDENT_AMBULATORY_CARE_PROVIDER_SITE_OTHER): Payer: Federal, State, Local not specified - PPO | Admitting: Family Medicine

## 2015-06-08 ENCOUNTER — Encounter: Payer: Self-pay | Admitting: Family Medicine

## 2015-06-08 VITALS — BP 126/88 | HR 86 | Temp 97.6°F | Wt 232.0 lb

## 2015-06-08 DIAGNOSIS — B079 Viral wart, unspecified: Secondary | ICD-10-CM

## 2015-06-08 NOTE — Progress Notes (Signed)
   Subjective:    Patient ID: Gregory Lyons, male    DOB: 25-Mar-1975, 40 y.o.   MRN: [id number]  HPI The patient has 2 warts on his hands. He has a 4 mm wart on the dorsum of the right third PIP joint. He has a 4 mm wart on the dorsum of the left third PIP joint. Past Medical History  Diagnosis Date  . Complication of anesthesia     pt states gets mean   . Sleep apnea     pt states does not use CPAP pt scored 5 on stop bang tool results sent to PCP    Past Surgical History  Procedure Laterality Date  . Right knee arthroscopy      12 to 13 years ago   . Tendon repair      left hand 2007   . Knee arthroscopy Right 03/12/2014    Procedure: ARTHROSCOPY RIGHT KNEE WITH MENISCAL DEBRIDEMENT condroplasty;  Surgeon: Loanne DrillingFrank Aluisio V, MD;  Location: [facility];  Service: Orthopedics;  Laterality: Right;   Current Outpatient Prescriptions on File Prior to Visit  Medication Sig Dispense Refill  . amoxicillin-clavulanate (AUGMENTIN) 875-125 MG tablet Take 1 tablet by mouth 2 (two) times daily. 20 tablet 0  . HYDROcodone-acetaminophen (NORCO) 5-325 MG per tablet Take 1-2 tablets by mouth every 6 (six) hours as needed for moderate pain. 40 tablet 0  . methocarbamol (ROBAXIN) 500 MG tablet Take 1 tablet (500 mg total) by mouth 4 (four) times daily. 30 tablet 1   No current facility-administered medications on file prior to visit.   No Known Allergies Social History   Social History  . Marital Status: Married    Spouse Name: N/A  . Number of Children: N/A  . Years of Education: N/A   Occupational History  . Not on file.   Social History Main Topics  . Smoking status: Former Smoker -- 1.00 packs/day for 30 years    Types: Cigarettes    Quit date: 02/15/2008  . Smokeless tobacco: Current User    Types: Chew  . Alcohol Use: Yes     Comment: occas beer   . Drug Use: No  . Sexual Activity: Not on file   Other Topics Concern  . Not on file   Social History Narrative      Review of  Systems  All other systems reviewed and are negative.      Objective:   Physical Exam  Cardiovascular: Normal rate, regular rhythm and normal heart sounds.   Pulmonary/Chest: Effort normal and breath sounds normal.  Vitals reviewed.         Assessment & Plan:  Warts  Both warts were treated with liquid nitrogen cryotherapy for a total of 30 seconds each. I recommended that if they return, he treat him aggressively and early with Dr. Margart SicklesScholl's wart and callus remover. Otherwise we can repeat liquid nitrogen cryotherapy until resolved.

## 2016-01-22 ENCOUNTER — Other Ambulatory Visit: Payer: Self-pay | Admitting: *Deleted

## 2016-01-22 ENCOUNTER — Other Ambulatory Visit: Payer: Self-pay | Admitting: Family Medicine

## 2016-01-22 MED ORDER — AMOXICILLIN-POT CLAVULANATE 875-125 MG PO TABS: 1.0000 | ORAL_TABLET | Freq: Two times a day (BID) | ORAL | 0 refills | Status: DC

## 2016-04-19 DIAGNOSIS — G4733 Obstructive sleep apnea (adult) (pediatric): Secondary | ICD-10-CM | POA: Diagnosis not present

## 2016-05-19 ENCOUNTER — Encounter (HOSPITAL_COMMUNITY)
Admission: EM | Disposition: A | Discharge status: Home / Self Care | Payer: Self-pay | Source: Home / Self Care | Attending: Emergency Medicine

## 2016-05-19 ENCOUNTER — Encounter (HOSPITAL_COMMUNITY): Payer: Self-pay | Admitting: Emergency Medicine

## 2016-05-19 ENCOUNTER — Emergency Department (HOSPITAL_COMMUNITY)
Admission: EM | Admit: 2016-05-19 | Discharge: 2016-05-19 | Disposition: A | Discharge status: Home / Self Care | Payer: Federal, State, Local not specified - PPO | Attending: Gastroenterology | Admitting: Gastroenterology

## 2016-05-19 DIAGNOSIS — Z9889 Other specified postprocedural states: Secondary | ICD-10-CM | POA: Insufficient documentation

## 2016-05-19 DIAGNOSIS — T18128A Food in esophagus causing other injury, initial encounter: Secondary | ICD-10-CM | POA: Diagnosis not present

## 2016-05-19 DIAGNOSIS — G4730 Sleep apnea, unspecified: Secondary | ICD-10-CM | POA: Insufficient documentation

## 2016-05-19 DIAGNOSIS — R1310 Dysphagia, unspecified: Secondary | ICD-10-CM | POA: Diagnosis not present

## 2016-05-19 DIAGNOSIS — B9681 Helicobacter pylori [H. pylori] as the cause of diseases classified elsewhere: Secondary | ICD-10-CM | POA: Diagnosis not present

## 2016-05-19 DIAGNOSIS — X58XXXA Exposure to other specified factors, initial encounter: Secondary | ICD-10-CM | POA: Insufficient documentation

## 2016-05-19 DIAGNOSIS — Z87891 Personal history of nicotine dependence: Secondary | ICD-10-CM | POA: Insufficient documentation

## 2016-05-19 DIAGNOSIS — K228 Other specified diseases of esophagus: Secondary | ICD-10-CM | POA: Diagnosis not present

## 2016-05-19 DIAGNOSIS — K222 Esophageal obstruction: Secondary | ICD-10-CM | POA: Diagnosis not present

## 2016-05-19 DIAGNOSIS — K269 Duodenal ulcer, unspecified as acute or chronic, without hemorrhage or perforation: Secondary | ICD-10-CM | POA: Insufficient documentation

## 2016-05-19 DIAGNOSIS — K2950 Unspecified chronic gastritis without bleeding: Secondary | ICD-10-CM | POA: Diagnosis not present

## 2016-05-19 DIAGNOSIS — T18108A Unspecified foreign body in esophagus causing other injury, initial encounter: Secondary | ICD-10-CM | POA: Diagnosis not present

## 2016-05-19 DIAGNOSIS — K200 Eosinophilic esophagitis: Secondary | ICD-10-CM | POA: Diagnosis not present

## 2016-05-19 HISTORY — PX: ESOPHAGEAL DILATION: SHX303

## 2016-05-19 HISTORY — PX: ESOPHAGOGASTRODUODENOSCOPY: SHX5428

## 2016-05-19 LAB — I-STAT CHEM 8, ED
BUN: 11 mg/dL (ref 6–20)
Calcium, Ion: 1.13 mmol/L — ABNORMAL LOW (ref 1.15–1.40)
Chloride: 106 mmol/L (ref 101–111)
Creatinine, Ser: 1.1 mg/dL (ref 0.61–1.24)
Glucose, Bld: 93 mg/dL (ref 65–99)
HEMATOCRIT: 37 % — AB (ref 39.0–52.0)
Hemoglobin: 12.6 g/dL — ABNORMAL LOW (ref 13.0–17.0)
POTASSIUM: 3.4 mmol/L — AB (ref 3.5–5.1)
SODIUM: 139 mmol/L (ref 135–145)
TCO2: 31 mmol/L (ref 0–100)

## 2016-05-19 SURGERY — EGD (ESOPHAGOGASTRODUODENOSCOPY)
Anesthesia: Moderate Sedation

## 2016-05-19 MED ORDER — LIDOCAINE VISCOUS 2 % MT SOLN
OROMUCOSAL | Status: AC
  Filled 2016-05-19: qty 15

## 2016-05-19 MED ORDER — DEXLANSOPRAZOLE 60 MG PO CPDR: DELAYED_RELEASE_CAPSULE | ORAL | 11 refills | Status: DC

## 2016-05-19 MED ORDER — MEPERIDINE HCL 100 MG/ML IJ SOLN
INTRAMUSCULAR | Status: DC | PRN
  Administered 2016-05-19 (×2): 25 mg via INTRAVENOUS
  Administered 2016-05-19: 50 mg via INTRAVENOUS

## 2016-05-19 MED ORDER — MIDAZOLAM HCL 5 MG/5ML IJ SOLN
INTRAMUSCULAR | Status: DC | PRN
  Administered 2016-05-19 (×2): 2 mg via INTRAVENOUS
  Administered 2016-05-19: 1 mg via INTRAVENOUS

## 2016-05-19 MED ORDER — MIDAZOLAM HCL 5 MG/5ML IJ SOLN
INTRAMUSCULAR | Status: AC
  Filled 2016-05-19: qty 10

## 2016-05-19 MED ORDER — SODIUM CHLORIDE 0.9% FLUSH
INTRAVENOUS | Status: AC
  Filled 2016-05-19: qty 10

## 2016-05-19 MED ORDER — STERILE WATER FOR IRRIGATION IR SOLN
Status: DC | PRN
  Administered 2016-05-19: 22:00:00

## 2016-05-19 MED ORDER — MEPERIDINE HCL 100 MG/ML IJ SOLN
INTRAMUSCULAR | Status: AC
  Filled 2016-05-19: qty 2

## 2016-05-19 MED ORDER — PROMETHAZINE HCL 25 MG/ML IJ SOLN
INTRAMUSCULAR | Status: AC
  Filled 2016-05-19: qty 1

## 2016-05-19 MED ORDER — SIMETHICONE 40 MG/0.6ML PO SUSP
ORAL | Status: AC
  Filled 2016-05-19: qty 30

## 2016-05-19 MED ORDER — LIDOCAINE VISCOUS 2 % MT SOLN
OROMUCOSAL | Status: DC | PRN
  Administered 2016-05-19: 1 via OROMUCOSAL

## 2016-05-19 MED ORDER — PROMETHAZINE HCL 25 MG/ML IJ SOLN
INTRAMUSCULAR | Status: DC | PRN
  Administered 2016-05-19: 25 mg via INTRAVENOUS

## 2016-05-19 NOTE — ED Triage Notes (Signed)
Pt states he was eating tenderloin and feels like it is stuck in his throat.  Unable to swallow saliva

## 2016-05-19 NOTE — Discharge Instructions (Addendum)
I REMOVED FOOD FROM YOUR ESOPHAGUS. YOU PROBABLY HAVE EOSINOPHILIC ESOPHAGITIS. You have EROSIVE gastritis AND DUODENITIS. I biopsied your stomach.   AVOID REFLUX TRIGGERS. SEE INFO BELOW.  START DEXILANT DAILY.  FOLLOW A SOFT MECHANICAL DIET.  MEATS SHOULD BE GROUND ONLY. SEE INFO BELOW.  YOUR BIOPSY RESULTS WILL BE AVAILABLE IN MY CHART AFTER APR 9 AND MY OFFICE WILL CONTACT YOU IN 10-14 DAYS WITH YOUR RESULTS.   REPEAT UPPER ENDOSCOPY TO COMPLETE YOUR DILATION IN 4 WEEKS.  FOLLOW UP IN 4 MOS.   UPPER ENDOSCOPY AFTER CARE Read the instructions outlined below and refer to this sheet in the next week. These discharge instructions provide you with general information on caring for yourself after you leave the hospital. While your treatment has been planned according to the most current medical practices available, unavoidable complications occasionally occur. If you have any problems or questions after discharge, call DR. FIELDS, [phone].  ACTIVITY  You may resume your regular activity, but move at a slower pace for the next 24 hours.   Take frequent rest periods for the next 24 hours.   Walking will help get rid of the air and reduce the bloated feeling in your belly (abdomen).   No driving for 24 hours (because of the medicine (anesthesia) used during the test).   You may shower.   Do not sign any important legal documents or operate any machinery for 24 hours (because of the anesthesia used during the test).    NUTRITION  Drink plenty of fluids.   You may resume your normal diet as instructed by your doctor.   Begin with a light meal and progress to your normal diet. Heavy or fried foods are harder to digest and may make you feel sick to your stomach (nauseated).   Avoid alcoholic beverages for 24 hours or as instructed.    MEDICATIONS  You may resume your normal medications.   WHAT YOU CAN EXPECT TODAY  Some feelings of bloating in the abdomen.   Passage  of more gas than usual.    IF YOU HAD A BIOPSY TAKEN DURING THE UPPER ENDOSCOPY:  Eat a soft diet IF YOU HAVE NAUSEA, BLOATING, ABDOMINAL PAIN, OR VOMITING.    FINDING OUT THE RESULTS OF YOUR TEST Not all test results are available during your visit. DR. Darrick Penna WILL CALL YOU WITHIN 7 DAYS OF YOUR PROCEDUE WITH YOUR RESULTS. Do not assume everything is normal if you have not heard from DR. FIELDS IN ONE WEEK, CALL HER OFFICE AT [phone].  SEEK IMMEDIATE MEDICAL ATTENTION AND CALL THE OFFICE: [phone] IF:  You have more than a spotting of blood in your stool.   Your belly is swollen (abdominal distention).   You are nauseated or vomiting.   You have a temperature over 101F.   You have abdominal pain or discomfort that is severe or gets worse throughout the day.    TAKE YOUR ASPIRIN DAILY.  CONTINUE OMEPRAZOLE TWICE DAILY. TAKE 30 MINUTES TO 1 HOUR PRIOR TO MEALS.  CONTINUE TENUATE. TAKE  HOUR BEFORE MEALS UP TO 3 TIMES A DAY.  DRINK WATER. AVOID KOOL-AID, SWEET TEA, JUICE, & SODA.  FOLLOW UP IN 2 MOS.     Lifestyle and home remedies TO CONTROL HEARTBURN/REFLUX  You may eliminate or reduce the frequency of heartburn by making the following lifestyle changes:   Control your weight. Being overweight is a major risk factor for heartburn and GERD. Excess pounds put pressure on your abdomen, pushing  up your stomach and causing acid to back up into your esophagus.    Eat smaller meals. 4 TO 6 MEALS A DAY. This reduces pressure on the lower esophageal sphincter, helping to prevent the valve from opening and acid from washing back into your esophagus.    Loosen your belt. Clothes that fit tightly around your waist put pressure on your abdomen and the lower esophageal sphincter.    Eliminate heartburn triggers. Everyone has specific triggers. Common triggers such as fatty or fried foods, spicy food, tomato sauce, carbonated beverages, alcohol, chocolate, mint,  garlic, onion, caffeine and nicotine may make heartburn worse.    Avoid stooping or bending. Tying your shoes is OK. Bending over for longer periods to weed your garden isn't, especially soon after eating.    Don't lie down after a meal. Wait at least three to four hours after eating before going to bed, and don't lie down right after eating.    Alternative medicine  Several home remedies exist for treating GERD, but they provide only temporary relief. They include drinking baking soda (sodium bicarbonate) added to water or drinking other fluids such as baking soda mixed with cream of tartar and water.   Although these liquids create temporary relief by neutralizing, washing away or buffering acids, eventually they aggravate the situation by adding gas and fluid to your stomach, increasing pressure and causing more acid reflux. Further, adding more sodium to your diet may increase your blood pressure and add stress to your heart, and excessive bicarbonate ingestion can alter the acid-base balance in your body.  Gastritis  Gastritis is an inflammation (the body's way of reacting to injury and/or infection) of the stomach. It is often caused by viral or bacterial (germ) infections. It can also be caused BY ASPIRIN, BC/GOODY POWDER'S, (IBUPROFEN) MOTRIN, OR ALEVE (NAPROXEN), chemicals (including alcohol), SPICY FOODS, and medications. This illness may be associated with generalized malaise (feeling tired, not well), UPPER ABDOMINAL STOMACH cramps, and fever. One common bacterial cause of gastritis is an organism known as H. Pylori. This can be treated with antibiotics.    ESOPHAGEAL STRICTURE  Esophageal strictures can be caused by stomach acid backing up into the tube that carries food from the mouth down to the stomach (lower esophagus).  TREATMENT There are a number of medicines used to treat reflux/stricture, including: Antacids.  Proton-pump inhibitors: DEXILANT ZANTAC OR  PEPCID   SOFT MECHANICAL DIET This SOFT MECHANICAL DIET is restricted to:  Foods that are moist, soft-textured, and easy to chew and swallow.   Meats that are ground. YOU MAY ADD gravy or sauce.   Foods that do not include bread or bread-like textures except soft pancakes, well-moistened with syrup or sauce.   Textures with some chewing ability required.   Casseroles without rice.   Cooked vegetables that are less than half an inch in size and easily mashed with a fork. No cooked corn, peas, broccoli, cauliflower, cabbage, Brussels sprouts, asparagus, or other fibrous, non-tender or rubbery cooked vegetables.   Canned fruit except for pineapple. Fruit must be cut into pieces no larger than half an inch in size.   Foods that do not include nuts, seeds, coconut, or sticky textures.   FOOD TEXTURES FOR DYSPHAGIA DIET LEVEL 2 -SOFT MECHANICAL DIET (includes all foods on Dysphagia Diet Level 1 - Pureed, in addition to the foods listed below)  FOOD GROUP: Breads. RECOMMENDED: Soft pancakes, well-moistened with syrup or sauce.  AVOID: All others.  FOOD GROUP: Cereals.  RECOMMENDED: Cooked cereals with little texture, including oatmeal. Unprocessed wheat bran stirred into cereals for bulk. Note: If thin liquids are restricted, it is important that all of the liquid is absorbed into the cereal.  AVOID: All dry cereals and any cooked cereals that may contain flax seeds or other seeds or nuts. Whole-grain, dry, or coarse cereals. Cereals with nuts, seeds, dried fruit, and/or coconut.  FOOD GROUP: Desserts. RECOMMENDED: Pudding, custard. Soft fruit pies with bottom crust only. Canned fruit (excluding pineapple). Soft, moist cakes with icing.Frozen malts, milk shakes, frozen yogurt, eggnog, nutritional supplements, ice cream, sherbet, regular or sugar-free gelatin, or any foods that become thin liquid at either room (70 F) or body temperature (98 F).  AVOID: Dry, coarse cakes and cookies.  Anything with nuts, seeds, coconut, pineapple, or dried fruit. Breakfast yogurt with nuts. Rice or bread pudding.  FOOD GROUP: Fats. RECOMMENDED: Butter, margarine, cream for cereal (depending on liquid consistency recommendations), gravy, cream sauces, sour cream, sour cream dips with soft additives, mayonnaise, salad dressings, cream cheese, cream cheese spreads with soft additives, whipped toppings.  AVOID: All fats with coarse or chunky additives.  FOOD GROUP: Fruits. RECOMMENDED: Soft drained, canned, or cooked fruits without seeds or skin. Fresh soft and ripe banana. Fruit juices with a small amount of pulp. If thin liquids are restricted, fruit juices should be thickened to appropriate consistency.  AVOID: Fresh or frozen fruits. Cooked fruit with skin or seeds. Dried fruits. Fresh, canned, or cooked pineapple.  FOOD GROUP: Meats and Meat Substitutes. (Meat pieces should not exceed 1/4 of an inch cube and should be tender.) RECOMMENDED: Moistened ground or cooked meat, poultry, or fish. Moist ground or tender meat may be served with gravy or sauce. Casseroles without rice. Moist macaroni and cheese, well-cooked pasta with meat sauce, tuna noodle casserole, soft, moist lasagna. Moist meatballs, meatloaf, or fish loaf. Protein salads, such as tuna or egg without large chunks, celery, or onion. Cottage cheese, smooth quiche without large chunks. Poached, scrambled, or soft-cooked eggs (egg yolks should not be runny but should be moist and able to be mashed with butter, margarine, or other moisture added to them). (Cook eggs to 160 F or use pasteurized eggs for safety.) Souffls may have small, soft chunks. Tofu. Well-cooked, slightly mashed, moist legumes, such as baked beans. All meats or protein substitutes should be served with sauces or moistened to help maintain cohesiveness in the oral cavity.  AVOID: Dry meats, tough meats (such as bacon, sausage, hot dogs, bratwurst). Dry casseroles  or casseroles with rice or large chunks. Peanut butter. Cheese slices and cubes. Hard-cooked or crisp fried eggs. Sandwiches.Pizza.  FOOD GROUP: Potatoes and Starches. RECOMMENDED: Well-cooked, moistened, boiled, baked, or mashed potatoes. Well-cooked shredded hash brown potatoes that are not crisp. (All potatoes need to be moist and in sauces.)Well-cooked noodles in sauce. Spaetzel or soft dumplings that have been moistened with butter or gravy.  AVOID: Potato skins and chips. Fried or French-fried potatoes. Rice.  FOOD GROUP: Soups. RECOMMENDED: Soups with easy-to-chew or easy-to-swallow meats or vegetables: Particle sizes in soups should be less than 1/2 inch. Soups will need to be thickened to appropriate consistency if soup is thinner than prescribed liquid consistency.  AVOID: Soups with large chunks of meat and vegetables. Soups with rice, corn, peas.  FOOD GROUP: Vegetables. RECOMMENDED: All soft, well-cooked vegetables. Vegetables should be less than a half inch. Should be easily mashed with a fork.  AVOID: Cooked corn and peas. Broccoli, cabbage, Brussels sprouts, asparagus,  or other fibrous, non-tender or rubbery cooked vegetables.  FOOD GROUP: Miscellaneous. RECOMMENDED: Jams and preserves without seeds, jelly. Sauces, salsas, etc., that may have small tender chunks less than 1/2 inch. Soft, smooth chocolate bars that are easily chewed.  AVOID: Seeds, nuts, coconut, or sticky foods. Chewy candies such as caramels or licorice.

## 2016-05-19 NOTE — H&P (Addendum)
  Primary Care Physician:  Leo Grosser, MD Primary Gastroenterologist:  Dr. Darrick Penna  Pre-Procedure History & Physical: HPI:  Gregory Lyons is a 41 y.o. male here for FOOD IMPACTION. OCCASIONAL SOLID FOOD DYSPHAGIA OVER PAST 23 YEARS. LAST EGD 2011 FOR FOOD IMPACTION, DISTAL ESO Bx: POSSIBLE EOE. PT TAKE PPI PRN. SX BROUGHT ON BY STRESS OR [place] FOOD. NO WEIGHT LOSS. APPETITE: GOOD.  LOSS SPITTING A LOT.   PT DENIES FEVER, CHILLS, HEMATOCHEZIA, HEMATEMESIS, nausea, melena, diarrhea, CHEST PAIN, SHORTNESS OF BREATH,  CHANGE IN BOWEL IN HABITS, constipation, OR problems with sedation.  Past Medical History:  Diagnosis Date  . Complication of anesthesia    pt states gets mean   . Sleep apnea    pt states does not use CPAP pt scored 5 on stop bang tool results sent to PCP     Past Surgical History:  Procedure Laterality Date  . KNEE ARTHROSCOPY Right 03/12/2014   Procedure: ARTHROSCOPY RIGHT KNEE WITH MENISCAL DEBRIDEMENT condroplasty;  Surgeon: Loanne Drilling, MD;  Location: [facility];  Service: Orthopedics;  Laterality: Right;  . right knee arthroscopy     12 to 13 years ago   . TENDON REPAIR     left hand 2007    Allergies as of 05/19/2016  . (No Known Allergies)   MEDS: NONE  FAMILY HISTORY: NO COLON CANCER/POLYP  Social History   Social History  . Marital status: Married    Spouse name: N/A  . Number of children: N/A  . Years of education: N/A   Occupational History  . Not on file.   Social History Main Topics  . Smoking status: Former Smoker    Packs/day: 1.00    Years: 30.00    Types: Cigarettes    Quit date: 02/15/2008  . Smokeless tobacco: Current User    Types: Chew  . Alcohol use Yes     Comment: occas beer   . Drug use: No  . Sexual activity: Not on file   Other Topics Concern  . Not on file   Social History Narrative  . No narrative on file   Review of Systems: See HPI, otherwise negative ROS  Physical Exam: BP (!) 139/92 (BP Location:  Left Arm)   Pulse 79   Temp 98.7 F (37.1 C) (Oral)   Resp 18   Ht 6' (1.829 m)   Wt 240 lb (108.9 kg)   SpO2 99%   BMI 32.55 kg/m  General:   Alert,  pleasant and cooperative in NAD Head:  Normocephalic and atraumatic. Neck:  Supple; Lungs:  Clear throughout to auscultation.    Heart:  Regular rate and rhythm. Abdomen:  Soft, nontender and nondistended. Normal bowel sounds, without guarding, and without rebound.   Neurologic:  Alert and  oriented x4;  grossly normal neurologically.  Impression/Plan:    FOOD IMPACTION  PLAN:  EGD/?DIL TODAY. DISCUSSED PROCEDURE, BENEFITS, & RISKS: < 1% chance of medication reaction, bleeding, OR perforation.

## 2016-05-19 NOTE — Op Note (Signed)
[facility] Patient Name: Gregory Lyons Procedure Date: 05/19/2016 9:28 PM MRN: [id number] Date of Birth: 06-Nov-1975 Attending MD: Jonette Eva , MD CSN: [id number] Age: 41 Admit Type: Outpatient Procedure:                Upper GI endoscopy Indications:              Foreign body in the esophagus Providers:                Jonette Eva, MD, Jannett Celestine, RN, Dyann Ruddle Referring MD:             Priscille Heidelberg. Pickard Medicines:                Promethazine 25 mg IV, Meperidine 100 mg IV,                            Midazolam 5 mg IV Complications:            No immediate complications. Estimated Blood Loss:     Estimated blood loss was minimal. Procedure:                Pre-Anesthesia Assessment:                           - Prior to the procedure, a History and Physical                            was performed, and patient medications and                            allergies were reviewed. The patient's tolerance of                            previous anesthesia was also reviewed. The risks                            and benefits of the procedure and the sedation                            options and risks were discussed with the patient.                            All questions were answered, and informed consent                            was obtained. Prior Anticoagulants: The patient has                            taken no previous anticoagulant or antiplatelet                            agents. ASA Grade Assessment: II - A patient with                            mild systemic disease. After reviewing the risks  and benefits, the patient was deemed in                            satisfactory condition to undergo the procedure.                            After obtaining informed consent, the endoscope was                            passed under direct vision. Throughout the                            procedure, the patient's blood pressure, pulse, and                        oxygen saturations were monitored continuously. The                            [id number] ([id number]) scope was introduced through the                            mouth, and advanced to the second part of duodenum.                            The upper GI endoscopy was technically difficult                            and complex due to the patient's agitation.                            Successful completion of the procedure was aided by                            increasing the dose of sedation medication. The                            patient tolerated the procedure fairly well. Scope In: 10:18:05 PM Scope Out: 10:42:58 PM Total Procedure Duration: 0 hours 24 minutes 53 seconds  Findings:      Food was found in the distal esophagus. Removal of food was accomplished.      One moderate (circumferential scarring or stenosis; an endoscope may       pass) benign-appearing, intrinsic stenosis was found. This measured 1.4       cm (inner diameter) and was traversed. Biopsies were obtained from the       proximal and distal esophagus with cold forceps for histology of       suspected eosinophilic esophagitis.      Patchy moderate inflammation characterized by congestion (edema) and       erythema was found in the gastric fundus, in the gastric body and in the       gastric antrum. This was biopsied with a cold forceps for histology.      Multiple localized erosions without bleeding were found in the duodenal       bulb and in the second portion of the duodenum.  LA Grade C (one or more mucosal breaks continuous between tops of 2 or       more mucosal folds, less than 75% circumference) esophagitis with       bleeding was found. Impression:               - Food in the distal esophagus. Removal was                            successful.                           - Benign-appearing esophageal stenosis. Biopsied.                           - Gastritis. Biopsied.                            - Duodenal erosions without bleeding.                           - EROSIVE ESOPHAGITIS Moderate Sedation:      Moderate (conscious) sedation was administered by the endoscopy nurse       and supervised by the endoscopist. The following parameters were       monitored: oxygen saturation, heart rate, blood pressure, and response       to care. Total physician intraservice time was 30 minutes. Recommendation:           - Await pathology results.                           - Repeat upper endoscopy in 1 month for retreatment.                           - Return to GI office in 4 months.                           - Soft diet.                           - Continue present medications.                           - Use Dexilant (dexlansoprazole) 60 mg PO daily.                           - Patient has a contact number available for                            emergencies. The signs and symptoms of potential                            delayed complications were discussed with the                            patient. Return to normal activities tomorrow.  Written discharge instructions were provided to the                            patient. Procedure Code(s):        --- Professional ---                           [phone], Esophagogastroduodenoscopy, flexible,                            transoral; with removal of foreign body(s)                           43239, Esophagogastroduodenoscopy, flexible,                            transoral; with biopsy, single or multiple                           99152, Moderate sedation services provided by the                            same physician or other qualified health care                            professional performing the diagnostic or                            therapeutic service that the sedation supports,                            requiring the presence of an independent trained                            observer to assist  in the monitoring of the                            patient's level of consciousness and physiological                            status; initial 15 minutes of intraservice time,                            patient age 37 years or older                           [phone], Moderate sedation services; each additional                            15 minutes intraservice time Diagnosis Code(s):        --- Professional ---                           U98.119J, Food in esophagus causing other injury,  initial encounter                           K22.2, Esophageal obstruction                           K29.70, Gastritis, unspecified, without bleeding                           K26.9, Duodenal ulcer, unspecified as acute or                            chronic, without hemorrhage or perforation                           T18.108A, Unspecified foreign body in esophagus                            causing other injury, initial encounter CPT copyright 2016 [employer]. All rights reserved. The codes documented in this report are preliminary and upon coder review may  be revised to meet current compliance requirements. Jonette Eva, MD Jonette Eva, MD 05/19/2016 10:57:03 PM This report has been signed electronically. Number of Addenda: 0

## 2016-05-19 NOTE — ED Provider Notes (Signed)
[facility] Provider Note   CSN: [id number] Arrival date & time: 05/19/16  1930     History   Chief Complaint Chief Complaint  Patient presents with  . Swallowed Foreign Body    HPI Gregory Lyons is a 41 y.o. male.  HPI  Pt was seen at 2020. Per pt, c/o sudden onset and persistence of constant esophageal FB that began approximately 1840 tonight. Pt states he was eating steak "when it got caught" in his mid-esophagus area. Pt states he has hx esophageal FB removed by GI Dr. Elnoria Howard at [facility] in 2011. Denies abd pain, no N/V, no back pain, no SOB/wheezing.    Past Medical History:  Diagnosis Date  . Complication of anesthesia    pt states gets mean   . Sleep apnea    pt states does not use CPAP pt scored 5 on stop bang tool results sent to PCP     Patient Active Problem List   Diagnosis Date Noted  . Acute medial meniscal tear 03/11/2014    Past Surgical History:  Procedure Laterality Date  . KNEE ARTHROSCOPY Right 03/12/2014   Procedure: ARTHROSCOPY RIGHT KNEE WITH MENISCAL DEBRIDEMENT condroplasty;  Surgeon: Loanne Drilling, MD;  Location: [facility];  Service: Orthopedics;  Laterality: Right;  . right knee arthroscopy     12 to 13 years ago   . TENDON REPAIR     left hand 2007        Home Medications    Prior to Admission medications   Medication Sig Start Date End Date Taking? Authorizing Provider  amoxicillin-clavulanate (AUGMENTIN) 875-125 MG tablet Take 1 tablet by mouth 2 (two) times daily. 01/22/16   Donita Brooks, MD  HYDROcodone-acetaminophen (NORCO) 5-325 MG per tablet Take 1-2 tablets by mouth every 6 (six) hours as needed for moderate pain. 03/12/14   Ollen Gross, MD  methocarbamol (ROBAXIN) 500 MG tablet Take 1 tablet (500 mg total) by mouth 4 (four) times daily. 03/12/14   Ollen Gross, MD    Family History History reviewed. No pertinent family history.  Social History Social History  Substance Use Topics  . Smoking  status: Former Smoker    Packs/day: 1.00    Years: 30.00    Types: Cigarettes    Quit date: 02/15/2008  . Smokeless tobacco: Current User    Types: Chew  . Alcohol use Yes     Comment: occas beer      Allergies   Patient has no known allergies.   Review of Systems Review of Systems ROS: Statement: All systems negative except as marked or noted in the HPI; Constitutional: Negative for fever and chills. ; ; Eyes: Negative for eye pain, redness and discharge. ; ; ENMT: Negative for ear pain, hoarseness, nasal congestion, sinus pressure and sore throat. ; ; Cardiovascular: Negative for chest pain, palpitations, diaphoresis, dyspnea and peripheral edema. ; ; Respiratory: Negative for cough, wheezing and stridor. ; ; Gastrointestinal: +esophageal FB. Negative for nausea, vomiting, diarrhea, abdominal pain, blood in stool, hematemesis, jaundice and rectal bleeding. . ; ; Genitourinary: Negative for dysuria, flank pain and hematuria. ; ; Musculoskeletal: Negative for back pain and neck pain. Negative for swelling and trauma.; ; Skin: Negative for pruritus, rash, abrasions, blisters, bruising and skin lesion.; ; Neuro: Negative for headache, lightheadedness and neck stiffness. Negative for weakness, altered level of consciousness, altered mental status, extremity weakness, paresthesias, involuntary movement, seizure and syncope.       Physical Exam Updated Vital  Signs BP (!) 139/92 (BP Location: Left Arm)   Pulse 79   Temp 98.7 F (37.1 C) (Oral)   Resp 18   Ht 6' (1.829 m)   Wt 240 lb (108.9 kg)   SpO2 99%   BMI 32.55 kg/m   Physical Exam 2025: Physical examination:  Nursing notes reviewed; Vital signs and O2 SAT reviewed;  Constitutional: Well developed, Well nourished, Well hydrated, In no acute distress; Head:  Normocephalic, atraumatic; Eyes: EOMI, PERRL, No scleral icterus; ENMT: Mouth and pharynx normal, Mucous membranes moist; Neck: Supple, Full range of motion, No lymphadenopathy;  Cardiovascular: Regular rate and rhythm, No gallop; Respiratory: Breath sounds clear & equal bilaterally, No wheezes.  Speaking full sentences with ease, Normal respiratory effort/excursion; Chest: Nontender, Movement normal; Abdomen: Soft, Nontender, Nondistended, Normal bowel sounds; Genitourinary: No CVA tenderness; Extremities: Pulses normal, No tenderness, No edema, No calf edema or asymmetry.; Neuro: AA&Ox3, Major CN grossly intact.  Speech clear. No gross focal motor or sensory deficits in extremities. Climbs on and off stretcher easily by himself. Gait steady.; Skin: Color normal, Warm, Dry.   ED Treatments / Results  Labs (all labs ordered are listed, but only abnormal results are displayed)   EKG  EKG Interpretation None       Radiology   Procedures Procedures (including critical care time)  Medications Ordered in ED Medications - No data to display   Initial Impression / Assessment and Plan / ED Course  I have reviewed the triage vital signs and the nursing notes.  Pertinent labs & imaging results that were available during my care of the patient were reviewed by me and considered in my medical decision making (see chart for details).  MDM Reviewed: previous chart, nursing note and vitals Reviewed previous: labs Interpretation: labs   Results for orders placed or performed during the hospital encounter of 05/19/16  I-stat Chem 8, ED  Result Value Ref Range   Sodium 139 135 - 145 mmol/L   Potassium 3.4 (L) 3.5 - 5.1 mmol/L   Chloride 106 101 - 111 mmol/L   BUN 11 6 - 20 mg/dL   Creatinine, Ser 1.61 0.61 - 1.24 mg/dL   Glucose, Bld 93 65 - 99 mg/dL   Calcium, Ion 0.96 (L) 1.15 - 1.40 mmol/L   TCO2 31 0 - 100 mmol/L   Hemoglobin 12.6 (L) 13.0 - 17.0 g/dL   HCT 04.5 (L) 40.9 - 81.1 %    2035:  Pt is not drooling, but swallows his secretions and a short time later spits them up. Pt unable to tol PO fluids without immediately regurgitating. T/C to GI Dr.  Darrick Penna, case discussed, including:  HPI, pertinent PM/SHx, VS/PE, dx testing, ED course and treatment:  Agreeable to come to hospital for EGD. Pt aware.    Final Clinical Impressions(s) / ED Diagnoses   Final diagnoses:  None    New Prescriptions New Prescriptions   No medications on file     Samuel Jester, DO 05/21/16 2352

## 2016-05-20 DIAGNOSIS — G4733 Obstructive sleep apnea (adult) (pediatric): Secondary | ICD-10-CM | POA: Diagnosis not present

## 2016-05-23 ENCOUNTER — Ambulatory Visit (INDEPENDENT_AMBULATORY_CARE_PROVIDER_SITE_OTHER): Payer: Federal, State, Local not specified - PPO | Admitting: Family Medicine

## 2016-05-23 ENCOUNTER — Encounter: Payer: Self-pay | Admitting: Family Medicine

## 2016-05-23 VITALS — BP 114/78 | HR 68 | Temp 98.7°F | Resp 18 | Wt 234.0 lb

## 2016-05-23 DIAGNOSIS — Z13220 Encounter for screening for lipoid disorders: Secondary | ICD-10-CM | POA: Diagnosis not present

## 2016-05-23 DIAGNOSIS — K209 Esophagitis, unspecified without bleeding: Secondary | ICD-10-CM

## 2016-05-23 DIAGNOSIS — G4730 Sleep apnea, unspecified: Secondary | ICD-10-CM | POA: Diagnosis not present

## 2016-05-23 MED ORDER — OMEPRAZOLE 40 MG PO CPDR: 40.0000 mg | DELAYED_RELEASE_CAPSULE | Freq: Every day | ORAL | 11 refills | Status: DC

## 2016-05-23 NOTE — Progress Notes (Signed)
Subjective:    Patient ID: Gregory Lyons, male    DOB: [birth year], 41 y.o.   MRN: [id number]  HPI Had sleep study in 2015 which revealed mod-severe OSA with AHI of 29 and REM AHI of 50.  Patient is currently wearing his CPAP machine on a nightly basis. He has his Conservator, museum/gallery with him today and reviewed. In the last 10 days he is only missed the machine 2 nights. Those 2 nights when he had a severe case of gastroenteritis and was vomiting constantly. He did not wear the machine most 2 nights however the remaining days a week he is averaging 5-6 hours every night at wearing machine. At times he does have a poor seal due to his facial hair. At other times he will inadvertently knock the mask off in his sleep but he is consistently wearing the machine 5-6 hours every night. His hypersomnia score is 4-5/h while on the machine and verified on his computer download. Recently had to be admitted to the hospital for a food impaction in his throat. EGD revealed esophagitis. Differential diagnosis is eosinophilic esophagitis versus gastritis/erosive esophagitis. He is not using a proton pump inhibitor on a daily basis and he continues to use tobacco.  Past Medical History:  Diagnosis Date  . Complication of anesthesia    pt states gets mean   . Sleep apnea    pt states does not use CPAP pt scored 5 on stop bang tool results sent to PCP    Past Surgical History:  Procedure Laterality Date  . KNEE ARTHROSCOPY Right 03/12/2014   Procedure: ARTHROSCOPY RIGHT KNEE WITH MENISCAL DEBRIDEMENT condroplasty;  Surgeon: Loanne Drilling, MD;  Location: [facility];  Service: Orthopedics;  Laterality: Right;  . right knee arthroscopy     12 to 13 years ago   . TENDON REPAIR     left hand 2007    Current Outpatient Prescriptions on File Prior to Visit  Medication Sig Dispense Refill  . dexlansoprazole (DEXILANT) 60 MG capsule 1 PO EVERY MORNING WITH BREAKFAST. (Patient not taking: Reported on 05/23/2016) 30  capsule 11   No current facility-administered medications on file prior to visit.    No Known Allergies Social History   Social History  . Marital status: Married    Spouse name: N/A  . Number of children: N/A  . Years of education: N/A   Occupational History  . Not on file.   Social History Main Topics  . Smoking status: Former Smoker    Packs/day: 1.00    Years: 30.00    Types: Cigarettes    Quit date: 02/15/2008  . Smokeless tobacco: Current User    Types: Chew  . Alcohol use Yes     Comment: occas beer   . Drug use: No  . Sexual activity: Not on file   Other Topics Concern  . Not on file   Social History Narrative  . No narrative on file     Review of Systems  All other systems reviewed and are negative.      Objective:   Physical Exam  Cardiovascular: Normal rate, regular rhythm and normal heart sounds.   Pulmonary/Chest: Effort normal and breath sounds normal. No respiratory distress. He has no wheezes. He has no rales.  Abdominal: Soft. Bowel sounds are normal.  Vitals reviewed.         Assessment & Plan:  Obstructive sleep apnea well controlled on CPAP. Patient is compliant with CPAP therapy. Patient  is wearing machine greater than 90% of the time for more than 6 hours every night. When compliant with his CPAP, his apnea hypotony index is 4-5/h indicating adequate control. I would like the patient to use a proton pump inhibitor on a daily basis. Due to cost, patient would like to try omeprazole 40 mg a day. If biopsy confirms eosinophilic esophagitis, I will try the patient on an inhaled corticosteroid and have him swallow instead. Continue to recommend cessation of oral tobacco products. Screen the patient for hyperlipidemia while he is here.

## 2016-05-24 LAB — COMPLETE METABOLIC PANEL WITH GFR
ALBUMIN: 4.4 g/dL (ref 3.6–5.1)
ALK PHOS: 45 U/L (ref 40–115)
ALT: 28 U/L (ref 9–46)
AST: 21 U/L (ref 10–40)
BILIRUBIN TOTAL: 0.4 mg/dL (ref 0.2–1.2)
BUN: 15 mg/dL (ref 7–25)
CALCIUM: 9 mg/dL (ref 8.6–10.3)
CO2: 27 mmol/L (ref 20–31)
CREATININE: 1.1 mg/dL (ref 0.60–1.35)
Chloride: 105 mmol/L (ref 98–110)
GFR, Est Non African American: 82 mL/min (ref >=60)
Glucose, Bld: 107 mg/dL — ABNORMAL HIGH (ref 70–99)
Potassium: 4.7 mmol/L (ref 3.5–5.3)
Sodium: 138 mmol/L (ref 135–146)
TOTAL PROTEIN: 7 g/dL (ref 6.1–8.1)

## 2016-05-24 LAB — LIPID PANEL
CHOLESTEROL: 164 mg/dL (ref <200)
HDL: 36 mg/dL — ABNORMAL LOW (ref >40)
LDL CALC: 109 mg/dL — AB (ref <100)
TRIGLYCERIDES: 93 mg/dL (ref <150)
Total CHOL/HDL Ratio: 4.6 Ratio (ref <5.0)
VLDL: 19 mg/dL (ref <30)

## 2016-05-25 ENCOUNTER — Telehealth: Payer: Self-pay | Admitting: Gastroenterology

## 2016-05-25 NOTE — Telephone Encounter (Signed)
PLEASE CALL PT. HE HAS EOSINOPHILIC ESOPHAGITIS.   AVOID REFLUX TRIGGERS.   START DEXILANT DAILY.  FOLLOW A SOFT MECHANICAL DIET.  MEATS SHOULD BE GROUND ONLY.   REPEAT UPPER ENDOSCOPY/DILATION AFTER MAY 5, DX: DYSPHAGIA/FOOD IMPACTION. NEEDS PHENERGAN 25 MG IV IN PREOP.  FOLLOW UP IN 4 MOS E30 EOE, DYSPHAGIA.

## 2016-05-26 NOTE — Telephone Encounter (Signed)
Pt is aware of results. 

## 2016-05-26 NOTE — Telephone Encounter (Signed)
ON RECALL FOR OFFICE VISIT  °

## 2016-05-31 NOTE — Telephone Encounter (Signed)
Called pt to set-up EGD/DIL. He doesn't want to proceed at this time. Stated he seen his PCP the Monday after last EGD. His PCP has changed his diet and he wants to wait to see how he does. He doesn't want to keep being put to sleep and "going down in there". Advised pt I would let [facility] know. Routing message to [facility].

## 2016-06-01 NOTE — Telephone Encounter (Signed)
PLEASE CALL PT. His esophagus I snot going to get better without endoscopy. HE WILL GET ANOTHER FOOD IMPACTION. HE NEEDS TO HAVE HIS EGD TO STRETCH HIS ESOPHAGUS AND I WILL TRY TO GET IT DONE IN ONE ENDOSCOPY. IF HE DOESN'T WANT TO SEE ME FOR THE ENDOSCOPY HE SHOULD SEE ANOTHER GASTROENTEROLOGIST.

## 2016-06-01 NOTE — Telephone Encounter (Signed)
I tried to call the patient, no answer,lmom 

## 2016-06-02 ENCOUNTER — Encounter (HOSPITAL_COMMUNITY): Payer: Self-pay | Admitting: Gastroenterology

## 2016-06-19 DIAGNOSIS — G4733 Obstructive sleep apnea (adult) (pediatric): Secondary | ICD-10-CM | POA: Diagnosis not present

## 2016-06-24 ENCOUNTER — Ambulatory Visit (INDEPENDENT_AMBULATORY_CARE_PROVIDER_SITE_OTHER): Payer: Federal, State, Local not specified - PPO | Admitting: Urology

## 2016-06-24 DIAGNOSIS — Z302 Encounter for sterilization: Secondary | ICD-10-CM | POA: Diagnosis not present

## 2016-07-20 DIAGNOSIS — G4733 Obstructive sleep apnea (adult) (pediatric): Secondary | ICD-10-CM | POA: Diagnosis not present

## 2016-08-09 ENCOUNTER — Encounter: Payer: Self-pay | Admitting: Gastroenterology

## 2016-08-19 DIAGNOSIS — G4733 Obstructive sleep apnea (adult) (pediatric): Secondary | ICD-10-CM | POA: Diagnosis not present

## 2016-08-25 ENCOUNTER — Other Ambulatory Visit: Payer: Self-pay | Admitting: *Deleted

## 2016-08-25 MED ORDER — OMEPRAZOLE 40 MG PO CPDR: 40.0000 mg | DELAYED_RELEASE_CAPSULE | Freq: Every day | ORAL | 4 refills | Status: DC

## 2016-08-31 ENCOUNTER — Ambulatory Visit (INDEPENDENT_AMBULATORY_CARE_PROVIDER_SITE_OTHER): Payer: Federal, State, Local not specified - PPO | Admitting: Urology

## 2016-08-31 DIAGNOSIS — Z302 Encounter for sterilization: Secondary | ICD-10-CM

## 2016-09-19 DIAGNOSIS — G4733 Obstructive sleep apnea (adult) (pediatric): Secondary | ICD-10-CM | POA: Diagnosis not present

## 2016-10-20 DIAGNOSIS — G4733 Obstructive sleep apnea (adult) (pediatric): Secondary | ICD-10-CM | POA: Diagnosis not present

## 2016-11-19 DIAGNOSIS — G4733 Obstructive sleep apnea (adult) (pediatric): Secondary | ICD-10-CM | POA: Diagnosis not present

## 2016-12-20 DIAGNOSIS — G4733 Obstructive sleep apnea (adult) (pediatric): Secondary | ICD-10-CM | POA: Diagnosis not present

## 2017-01-19 DIAGNOSIS — G4733 Obstructive sleep apnea (adult) (pediatric): Secondary | ICD-10-CM | POA: Diagnosis not present

## 2017-02-16 ENCOUNTER — Encounter: Payer: Self-pay | Admitting: Family Medicine

## 2017-02-16 ENCOUNTER — Ambulatory Visit (INDEPENDENT_AMBULATORY_CARE_PROVIDER_SITE_OTHER): Payer: Federal, State, Local not specified - PPO | Admitting: Family Medicine

## 2017-02-16 ENCOUNTER — Other Ambulatory Visit: Payer: Self-pay

## 2017-02-16 VITALS — BP 120/80 | HR 80 | Temp 98.1°F | Resp 20 | Wt 250.0 lb

## 2017-02-16 DIAGNOSIS — J069 Acute upper respiratory infection, unspecified: Secondary | ICD-10-CM

## 2017-02-16 LAB — INFLUENZA A AND B AG, IMMUNOASSAY
INFLUENZA A ANTIGEN: NOT DETECTED
INFLUENZA B ANTIGEN: NOT DETECTED

## 2017-02-16 NOTE — Progress Notes (Signed)
Subjective:    Patient ID: Gregory Lyons, male    DOB: April 30, 1975, 42 y.o.   MRN: [id number]  HPI  Symptoms began yesterday with subjective fevers, body aches, rhinorrhea, sore throat, cough, nausea, diarrhea.  He denies any shortness of breath. He denies any pleurisy. He denies any chest pain. He denies any rash. His wife had a similar viral illness earlier this week Past Medical History:  Diagnosis Date  . Complication of anesthesia    pt states gets mean   . Sleep apnea    pt states does not use CPAP pt scored 5 on stop bang tool results sent to PCP    Past Surgical History:  Procedure Laterality Date  . ESOPHAGEAL DILATION N/A 05/19/2016   Procedure: ESOPHAGEAL DILATION;  Surgeon: West BaliSandi L Fields, MD;  Location: [facility];  Service: Endoscopy;  Laterality: N/A;  . ESOPHAGOGASTRODUODENOSCOPY N/A 05/19/2016   Procedure: ESOPHAGOGASTRODUODENOSCOPY (EGD);  Surgeon: West BaliSandi L Fields, MD;  Location: [facility];  Service: Endoscopy;  Laterality: N/A;  . KNEE ARTHROSCOPY Right 03/12/2014   Procedure: ARTHROSCOPY RIGHT KNEE WITH MENISCAL DEBRIDEMENT condroplasty;  Surgeon: Loanne DrillingFrank Aluisio V, MD;  Location: [facility];  Service: Orthopedics;  Laterality: Right;  . right knee arthroscopy     12 to 13 years ago   . TENDON REPAIR     left hand 2007    Current Outpatient Medications on File Prior to Visit  Medication Sig Dispense Refill  . omeprazole (PRILOSEC) 40 MG capsule Take 1 capsule (40 mg total) by mouth daily. 90 capsule 4   No current facility-administered medications on file prior to visit.    No Known Allergies Social History   Socioeconomic History  . Marital status: Married    Spouse name: Not on file  . Number of children: Not on file  . Years of education: Not on file  . Highest education level: Not on file  Social Needs  . Financial resource strain: Not on file  . Food insecurity - worry: Not on file  . Food insecurity - inability: Not on file  . Transportation  needs - medical: Not on file  . Transportation needs - non-medical: Not on file  Occupational History  . Not on file  Tobacco Use  . Smoking status: Former Smoker    Packs/day: 1.00    Years: 30.00    Pack years: 30.00    Types: Cigarettes    Last attempt to quit: 02/15/2008    Years since quitting: 9.0  . Smokeless tobacco: Current User    Types: Chew  Substance and Sexual Activity  . Alcohol use: Yes    Comment: occas beer   . Drug use: No  . Sexual activity: Not on file  Other Topics Concern  . Not on file  Social History Narrative  . Not on file     Review of Systems  All other systems reviewed and are negative.      Objective:   Physical Exam  Constitutional: He appears well-developed and well-nourished. No distress.  HENT:  Right Ear: Tympanic membrane and ear canal normal.  Left Ear: Tympanic membrane and ear canal normal.  Nose: Mucosal edema and rhinorrhea present. Right sinus exhibits no maxillary sinus tenderness and no frontal sinus tenderness. Left sinus exhibits no maxillary sinus tenderness and no frontal sinus tenderness.  Mouth/Throat: Mucous membranes are normal. Posterior oropharyngeal edema present. No oropharyngeal exudate, posterior oropharyngeal erythema or tonsillar abscesses.  Neck: Neck supple.  Cardiovascular: Normal  rate, regular rhythm and normal heart sounds. Exam reveals no gallop and no friction rub.  No murmur heard. Pulmonary/Chest: Effort normal and breath sounds normal. No respiratory distress. He has no wheezes. He has no rales.  Abdominal: Soft. Bowel sounds are normal.  Lymphadenopathy:    He has no cervical adenopathy.  Skin: He is not diaphoretic.  Vitals reviewed.         Assessment & Plan:  Viral URI  Flu test today is negative. Strep test today is negative. Patient's symptoms are consistent with a viral upper respiratory infection. Patient is afebrile today in office. Blood pressure heart rate and respiratory rate are  stable. Recommended tincture of time and supportive care. Use ibuprofen and Tylenol for fever and body aches. Use Sudafed for congestion. Use Mucinex for cough. Rest and drink plenty of fluids. Recheck if symptoms worsen

## 2017-02-16 NOTE — Addendum Note (Signed)
Addended by: Phillips OdorSIX, Sherree Shankman H on: 02/16/2017 10:12 AM   Modules accepted: Orders

## 2017-02-18 LAB — CULTURE, GROUP A STREP
MICRO NUMBER:: 90008570
SOURCE:: 0
SPECIMEN QUALITY:: ADEQUATE

## 2017-02-18 LAB — STREP GROUP A AG, W/REFLEX TO CULT: Streptococcus, Group A Screen (Direct): NOT DETECTED

## 2017-02-27 ENCOUNTER — Encounter: Payer: Self-pay | Admitting: Family Medicine

## 2017-02-27 ENCOUNTER — Telehealth: Payer: Self-pay | Admitting: *Deleted

## 2017-02-27 MED ORDER — CELECOXIB 200 MG PO CAPS: 200.0000 mg | ORAL_CAPSULE | Freq: Every day | ORAL | 5 refills | Status: DC

## 2017-02-27 NOTE — Telephone Encounter (Signed)
Received call from patient wife.   Reports that patient would like a prescription for Celebrex for knee/ ankle pain.   Ok to order?

## 2017-02-27 NOTE — Telephone Encounter (Signed)
Prescription sent to pharmacy.   Patient wife is aware.

## 2017-02-27 NOTE — Telephone Encounter (Signed)
Ok 200 poqday 30 rf 5

## 2017-04-13 ENCOUNTER — Encounter: Payer: Self-pay | Admitting: Family Medicine

## 2017-04-13 ENCOUNTER — Ambulatory Visit
Admission: RE | Admit: 2017-04-13 | Discharge: 2017-04-13 | Disposition: A | Discharge status: Home / Self Care | Payer: Federal, State, Local not specified - PPO | Source: Ambulatory Visit | Attending: Family Medicine | Admitting: Family Medicine

## 2017-04-13 ENCOUNTER — Ambulatory Visit (INDEPENDENT_AMBULATORY_CARE_PROVIDER_SITE_OTHER): Payer: Federal, State, Local not specified - PPO | Admitting: Family Medicine

## 2017-04-13 VITALS — BP 122/68 | HR 78 | Temp 98.1°F | Wt 249.0 lb

## 2017-04-13 DIAGNOSIS — M79672 Pain in left foot: Secondary | ICD-10-CM

## 2017-04-13 NOTE — Progress Notes (Signed)
Subjective:    Patient ID: Gregory Lyons, male    DOB: 1975-07-16, 42 y.o.   MRN: [id number]  HPI  Patient is a 42 year old white male who works with the [employer].  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  Over the last several months, he has developed gradual pain in the dorsal midfoot of his left foot.  There is now bruising and swelling approximately over the left cuboid bone as well as the tarsometatarsal joint line.  He is tender to palpation in this area.  Onset of the pain was gradual over the last several months however over the last few days the pain has become intense and is worsening.  He can no longer walk normally on the foot.  He is having to walk on the fifth metatarsal and keep his weight off his longitudinal arch because normal ambulation and pronation with step off because of severe pain near the cuboid and tarsometatarsal joint over the lateral aspect of the dorsal midfoot.  Ironically walking on the fifth metatarsal alleviate some of that pain. Past Medical History:  Diagnosis Date  . Complication of anesthesia    pt states gets mean   . Sleep apnea    pt states does not use CPAP pt scored 5 on stop bang tool results sent to PCP    Past Surgical History:  Procedure Laterality Date  . ESOPHAGEAL DILATION N/A 05/19/2016   Procedure: ESOPHAGEAL DILATION;  Surgeon: West Bali, MD;  Location: [facility];  Service: Endoscopy;  Laterality: N/A;  . ESOPHAGOGASTRODUODENOSCOPY N/A 05/19/2016   Procedure: ESOPHAGOGASTRODUODENOSCOPY (EGD);  Surgeon: West Bali, MD;  Location: [facility];  Service: Endoscopy;  Laterality: N/A;  . KNEE ARTHROSCOPY Right 03/12/2014   Procedure: ARTHROSCOPY RIGHT KNEE WITH MENISCAL DEBRIDEMENT condroplasty;  Surgeon: Loanne Drilling, MD;  Location: [facility];  Service: Orthopedics;  Laterality: Right;  . right knee arthroscopy     12 to 13 years ago   . TENDON REPAIR     left hand 2007    Current Outpatient  Medications on File Prior to Visit  Medication Sig Dispense Refill  . celecoxib (CELEBREX) 200 MG capsule Take 1 capsule (200 mg total) by mouth daily. 30 capsule 5  . omeprazole (PRILOSEC) 40 MG capsule Take 1 capsule (40 mg total) by mouth daily. 90 capsule 4   No current facility-administered medications on file prior to visit.    No Known Allergies Social History   Socioeconomic History  . Marital status: Married    Spouse name: Not on file  . Number of children: Not on file  . Years of education: Not on file  . Highest education level: Not on file  Social Needs  . Financial resource strain: Not on file  . Food insecurity - worry: Not on file  . Food insecurity - inability: Not on file  . Transportation needs - medical: Not on file  . Transportation needs - non-medical: Not on file  Occupational History  . Not on file  Tobacco Use  . Smoking status: Former Smoker    Packs/day: 1.00    Years: 30.00    Pack years: 30.00    Types: Cigarettes    Last attempt to quit: 02/15/2008    Years since quitting: 9.1  . Smokeless tobacco: Current User    Types: Chew  Substance and Sexual Activity  . Alcohol use: Yes    Comment: occas beer   .  Drug use: No  . Sexual activity: Not on file  Other Topics Concern  . Not on file  Social History Narrative  . Not on file     Review of Systems  All other systems reviewed and are negative.      Objective:   Physical Exam  Cardiovascular: Normal rate, regular rhythm and normal heart sounds.  Pulmonary/Chest: Effort normal and breath sounds normal.  Musculoskeletal:       Left foot: There is tenderness, bony tenderness, swelling and deformity.       Feet:  Vitals reviewed.         Assessment & Plan:  Left foot pain - Plan: DG Foot Complete Left  Patient has either sprained his midfoot or his developed a stress fracture in the area circled on the diagram.  Begin by obtaining x-rays of the left foot pain close attention to  the cuboid bone as well as to the tarsal metatarsal joint line.  If x-rays are negative for acute fracture, I would recommend nonweightbearing times 2 weeks, Cam walker, and bone scan to evaluate for stress fracture if no better.

## 2017-04-14 ENCOUNTER — Encounter: Payer: Self-pay | Admitting: *Deleted

## 2017-05-02 ENCOUNTER — Other Ambulatory Visit: Payer: Self-pay | Admitting: *Deleted

## 2017-05-02 MED ORDER — VARENICLINE TARTRATE 0.5 MG X 11 & 1 MG X 42 PO MISC: ORAL | 0 refills | Status: DC

## 2017-06-13 ENCOUNTER — Other Ambulatory Visit: Payer: Self-pay | Admitting: *Deleted

## 2017-06-13 MED ORDER — CELECOXIB 200 MG PO CAPS: 200.0000 mg | ORAL_CAPSULE | Freq: Every day | ORAL | 3 refills | Status: DC

## 2017-07-19 DIAGNOSIS — J039 Acute tonsillitis, unspecified: Secondary | ICD-10-CM | POA: Diagnosis not present

## 2017-07-19 DIAGNOSIS — Z6834 Body mass index (BMI) 34.0-34.9, adult: Secondary | ICD-10-CM | POA: Diagnosis not present

## 2017-07-19 DIAGNOSIS — J029 Acute pharyngitis, unspecified: Secondary | ICD-10-CM | POA: Diagnosis not present

## 2017-09-05 ENCOUNTER — Other Ambulatory Visit: Payer: Self-pay | Admitting: Family Medicine

## 2017-10-06 DIAGNOSIS — G4733 Obstructive sleep apnea (adult) (pediatric): Secondary | ICD-10-CM | POA: Diagnosis not present

## 2017-11-17 ENCOUNTER — Encounter: Payer: Self-pay | Admitting: Family Medicine

## 2017-11-17 ENCOUNTER — Other Ambulatory Visit: Payer: Self-pay | Admitting: Family Medicine

## 2017-11-17 ENCOUNTER — Ambulatory Visit (INDEPENDENT_AMBULATORY_CARE_PROVIDER_SITE_OTHER): Payer: Federal, State, Local not specified - PPO | Admitting: Family Medicine

## 2017-11-17 VITALS — BP 126/90 | HR 78 | Temp 97.6°F | Wt 266.0 lb

## 2017-11-17 DIAGNOSIS — K5792 Diverticulitis of intestine, part unspecified, without perforation or abscess without bleeding: Secondary | ICD-10-CM | POA: Diagnosis not present

## 2017-11-17 MED ORDER — METRONIDAZOLE 500 MG PO TABS: 500.0000 mg | ORAL_TABLET | Freq: Two times a day (BID) | ORAL | 0 refills | Status: DC

## 2017-11-17 MED ORDER — CIPROFLOXACIN HCL 500 MG PO TABS
500.0000 mg | ORAL_TABLET | Freq: Two times a day (BID) | ORAL | 0 refills | Status: DC
Start: 2017-11-17 — End: 2017-12-01

## 2017-11-17 NOTE — Progress Notes (Signed)
Subjective:    Patient ID: Gregory Lyons, male    DOB: 08-14-1975, 42 y.o.   MRN: [id number]  HPI Patient presents with 1 week of intermittent left lower quadrant abdominal pain which is steadily worsened.  He developed a fever approximate 1 week ago but has been afebrile since.  He is now having sharp constant left lower quadrant abdominal pain.  It is made worse by movement.  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  He is extremely tender to palpation in the left lower quadrant.  Sitting up exacerbates the pain.  He denies any constipation.  He denies any melena.  He denies any hematochezia.  He denies any dysuria or hematuria.  The pain does not radiate into his back or down into his scrotum.  His abdomen is soft nondistended with normal bowel sounds.  He denies any nausea or vomiting however the pain at times is severe.  He denies any injury. Past Medical History:  Diagnosis Date  . Complication of anesthesia    pt states gets mean   . Sleep apnea    pt states does not use CPAP pt scored 5 on stop bang tool results sent to PCP    Past Surgical History:  Procedure Laterality Date  . ESOPHAGEAL DILATION N/A 05/19/2016   Procedure: ESOPHAGEAL DILATION;  Surgeon: West Bali, MD;  Location: [facility];  Service: Endoscopy;  Laterality: N/A;  . ESOPHAGOGASTRODUODENOSCOPY N/A 05/19/2016   Procedure: ESOPHAGOGASTRODUODENOSCOPY (EGD);  Surgeon: West Bali, MD;  Location: [facility];  Service: Endoscopy;  Laterality: N/A;  . KNEE ARTHROSCOPY Right 03/12/2014   Procedure: ARTHROSCOPY RIGHT KNEE WITH MENISCAL DEBRIDEMENT condroplasty;  Surgeon: Loanne Drilling, MD;  Location: [facility];  Service: Orthopedics;  Laterality: Right;  . right knee arthroscopy     12 to 13 years ago   . TENDON REPAIR     left hand 2007    Current Outpatient Medications on File Prior to Visit  Medication Sig Dispense Refill  . celecoxib (CELEBREX) 200 MG capsule  Take 1 capsule (200 mg total) by mouth daily. 90 capsule 3  . omeprazole (PRILOSEC) 40 MG capsule TAKE 1 CAPSULE BY MOUTH EVERY DAY 90 capsule 4  . varenicline (CHANTIX STARTING MONTH PAK) 0.5 MG X 11 & 1 MG X 42 tablet Take 0.5 mg tablet by mouth 1x daily for 3 days, then increase to 0.5 mg tablet 2x daily for 4 days, then increase to 1 mg tablet 2x daily. 53 tablet 0   No current facility-administered medications on file prior to visit.    No Known Allergies Social History   Socioeconomic History  . Marital status: Married    Spouse name: Not on file  . Number of children: Not on file  . Years of education: Not on file  . Highest education level: Not on file  Occupational History  . Not on file  Social Needs  . Financial resource strain: Not on file  . Food insecurity:    Worry: Not on file    Inability: Not on file  . Transportation needs:    Medical: Not on file    Non-medical: Not on file  Tobacco Use  . Smoking status: Former Smoker    Packs/day: 1.00    Years: 30.00    Pack years: 30.00    Types: Cigarettes    Last attempt to quit: 02/15/2008    Years since  quitting: 9.7  . Smokeless tobacco: Current User    Types: Chew  Substance and Sexual Activity  . Alcohol use: Yes    Comment: occas beer   . Drug use: No  . Sexual activity: Not on file  Lifestyle  . Physical activity:    Days per week: Not on file    Minutes per session: Not on file  . Stress: Not on file  Relationships  . Social connections:    Talks on phone: Not on file    Gets together: Not on file    Attends religious service: Not on file    Active member of club or organization: Not on file    Attends meetings of clubs or organizations: Not on file    Relationship status: Not on file  . Intimate partner violence:    Fear of current or ex partner: Not on file    Emotionally abused: Not on file    Physically abused: Not on file    Forced sexual activity: Not on file  Other Topics Concern  . Not  on file  Social History Narrative  . Not on file      Review of Systems  All other systems reviewed and are negative.      Objective:   Physical Exam  Constitutional: He appears well-developed and well-nourished.  Cardiovascular: Normal rate, regular rhythm and normal heart sounds.  Pulmonary/Chest: Effort normal and breath sounds normal.  Abdominal: Soft. Bowel sounds are normal. He exhibits no distension. There is tenderness. There is guarding.  Vitals reviewed.         Assessment & Plan:  Diverticulitis  I am suspicious for diverticulitis versus an abdominal wall muscle tear.  However his pain is out of proportion to what I would expect with an abdominal wall muscle tear.  Therefore I will cover the patient empirically with Cipro 500 mg p.o. twice daily and Flagyl 500 mg p.o. twice daily for 10 days.  If pain worsens, proceed with a CT scan of the abdomen and pelvis immediately.  If pain gradually improves no further work-up is necessary.  Patient is relatively young for diverticulitis but he states that he has been eating a lot of sunflower seeds and nuts recently with his diet and that seemed to trigger the pain.  He denies any specific motion or lifting episode where the pain began making a muscle tear unusual.  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

## 2017-11-29 ENCOUNTER — Other Ambulatory Visit: Payer: Self-pay | Admitting: *Deleted

## 2017-11-29 MED ORDER — AMOXICILLIN-POT CLAVULANATE 875-125 MG PO TABS: 1.0000 | ORAL_TABLET | Freq: Two times a day (BID) | ORAL | 0 refills | Status: DC

## 2017-12-01 ENCOUNTER — Ambulatory Visit (INDEPENDENT_AMBULATORY_CARE_PROVIDER_SITE_OTHER): Payer: Federal, State, Local not specified - PPO | Admitting: Family Medicine

## 2017-12-01 VITALS — BP 140/100 | HR 96 | Temp 98.0°F | Wt 258.0 lb

## 2017-12-01 DIAGNOSIS — R1314 Dysphagia, pharyngoesophageal phase: Secondary | ICD-10-CM | POA: Diagnosis not present

## 2017-12-01 MED ORDER — PREDNISONE 20 MG PO TABS
ORAL_TABLET | ORAL | 0 refills | Status: DC
Start: 2017-12-01 — End: 2018-05-01

## 2017-12-01 NOTE — Progress Notes (Signed)
Subjective:    Patient ID: Gregory Lyons, male    DOB: 17-Sep-1975, 42 y.o.   MRN: [id number]  HPI Patient's past medical history is significant for food impaction requiring EGD and extraction of retained food material in the distal esophagus.  Patient states that Tuesday he developed dysphasia.  He points to his anterior throat from his thyroid cartilage up to the base of his jaw.  He states that he is having a difficult time swallowing solids.  He can swallow water or liquids without difficulty.  However he feels like food is sticking.  He denies any significant sore throat.  However his tonsils have reportedly swollen.  He was started on Augmentin and was given a shot of Rocephin for this on Wednesday and has seen no improvement.  On visual inspection today, his left tonsil is touching the uvula.  His right tonsil is 2+ in size.  There is no erythema or exudate.  There is no lymphadenopathy or palpable mass in the anterior cervical chain.  He has no stridor.  There is no tracheal deviation.  He denies any trismus or other signs of a peritonsillar abscess.  He denies any difficulty breathing.  He denies any lip or tongue swelling.  He does have a history of eosinophilic esophagitis.  He is recently been having severe acid reflux.  I question if he may have esophagitis worsened by the acid reflux causing swelling and perceived pharyngeal esophageal phase dysphagia Past Medical History:  Diagnosis Date  . Complication of anesthesia    pt states gets mean   . Sleep apnea    pt states does not use CPAP pt scored 5 on stop bang tool results sent to PCP    Past Surgical History:  Procedure Laterality Date  . ESOPHAGEAL DILATION N/A 05/19/2016   Procedure: ESOPHAGEAL DILATION;  Surgeon: West Bali, MD;  Location: [facility];  Service: Endoscopy;  Laterality: N/A;  . ESOPHAGOGASTRODUODENOSCOPY N/A 05/19/2016   Procedure: ESOPHAGOGASTRODUODENOSCOPY (EGD);  Surgeon: West Bali, MD;  Location:  [facility];  Service: Endoscopy;  Laterality: N/A;  . KNEE ARTHROSCOPY Right 03/12/2014   Procedure: ARTHROSCOPY RIGHT KNEE WITH MENISCAL DEBRIDEMENT condroplasty;  Surgeon: Loanne Drilling, MD;  Location: [facility];  Service: Orthopedics;  Laterality: Right;  . right knee arthroscopy     12 to 13 years ago   . TENDON REPAIR     left hand 2007    Current Outpatient Medications on File Prior to Visit  Medication Sig Dispense Refill  . amoxicillin-clavulanate (AUGMENTIN) 875-125 MG tablet Take 1 tablet by mouth 2 (two) times daily. 20 tablet 0  . celecoxib (CELEBREX) 200 MG capsule Take 1 capsule (200 mg total) by mouth daily. 90 capsule 3  . omeprazole (PRILOSEC) 40 MG capsule TAKE 1 CAPSULE BY MOUTH EVERY DAY 90 capsule 4   No current facility-administered medications on file prior to visit.    No Known Allergies Social History   Socioeconomic History  . Marital status: Married    Spouse name: Not on file  . Number of children: Not on file  . Years of education: Not on file  . Highest education level: Not on file  Occupational History  . Not on file  Social Needs  . Financial resource strain: Not on file  . Food insecurity:    Worry: Not on file    Inability: Not on file  . Transportation needs:    Medical: Not on file  Non-medical: Not on file  Tobacco Use  . Smoking status: Former Smoker    Packs/day: 1.00    Years: 30.00    Pack years: 30.00    Types: Cigarettes    Last attempt to quit: 02/15/2008    Years since quitting: 9.8  . Smokeless tobacco: Current User    Types: Chew  Substance and Sexual Activity  . Alcohol use: Yes    Comment: occas beer   . Drug use: No  . Sexual activity: Not on file  Lifestyle  . Physical activity:    Days per week: Not on file    Minutes per session: Not on file  . Stress: Not on file  Relationships  . Social connections:    Talks on phone: Not on file    Gets together: Not on file    Attends religious service: Not on file     Active member of club or organization: Not on file    Attends meetings of clubs or organizations: Not on file    Relationship status: Not on file  . Intimate partner violence:    Fear of current or ex partner: Not on file    Emotionally abused: Not on file    Physically abused: Not on file    Forced sexual activity: Not on file  Other Topics Concern  . Not on file  Social History Narrative  . Not on file    Past Medical History:  Diagnosis Date  . Complication of anesthesia    pt states gets mean   . Sleep apnea    pt states does not use CPAP pt scored 5 on stop bang tool results sent to PCP    Past Surgical History:  Procedure Laterality Date  . ESOPHAGEAL DILATION N/A 05/19/2016   Procedure: ESOPHAGEAL DILATION;  Surgeon: West Bali, MD;  Location: [facility];  Service: Endoscopy;  Laterality: N/A;  . ESOPHAGOGASTRODUODENOSCOPY N/A 05/19/2016   Procedure: ESOPHAGOGASTRODUODENOSCOPY (EGD);  Surgeon: West Bali, MD;  Location: [facility];  Service: Endoscopy;  Laterality: N/A;  . KNEE ARTHROSCOPY Right 03/12/2014   Procedure: ARTHROSCOPY RIGHT KNEE WITH MENISCAL DEBRIDEMENT condroplasty;  Surgeon: Loanne Drilling, MD;  Location: [facility];  Service: Orthopedics;  Laterality: Right;  . right knee arthroscopy     12 to 13 years ago   . TENDON REPAIR     left hand 2007    Current Outpatient Medications on File Prior to Visit  Medication Sig Dispense Refill  . amoxicillin-clavulanate (AUGMENTIN) 875-125 MG tablet Take 1 tablet by mouth 2 (two) times daily. 20 tablet 0  . celecoxib (CELEBREX) 200 MG capsule Take 1 capsule (200 mg total) by mouth daily. 90 capsule 3  . omeprazole (PRILOSEC) 40 MG capsule TAKE 1 CAPSULE BY MOUTH EVERY DAY 90 capsule 4   No current facility-administered medications on file prior to visit.    No Known Allergies Social History   Socioeconomic History  . Marital status: Married    Spouse name: Not on file  . Number of children: Not on file   . Years of education: Not on file  . Highest education level: Not on file  Occupational History  . Not on file  Social Needs  . Financial resource strain: Not on file  . Food insecurity:    Worry: Not on file    Inability: Not on file  . Transportation needs:    Medical: Not on file    Non-medical: Not on file  Tobacco Use  . Smoking status: Former Smoker    Packs/day: 1.00    Years: 30.00    Pack years: 30.00    Types: Cigarettes    Last attempt to quit: 02/15/2008    Years since quitting: 9.8  . Smokeless tobacco: Current User    Types: Chew  Substance and Sexual Activity  . Alcohol use: Yes    Comment: occas beer   . Drug use: No  . Sexual activity: Not on file  Lifestyle  . Physical activity:    Days per week: Not on file    Minutes per session: Not on file  . Stress: Not on file  Relationships  . Social connections:    Talks on phone: Not on file    Gets together: Not on file    Attends religious service: Not on file    Active member of club or organization: Not on file    Attends meetings of clubs or organizations: Not on file    Relationship status: Not on file  . Intimate partner violence:    Fear of current or ex partner: Not on file    Emotionally abused: Not on file    Physically abused: Not on file    Forced sexual activity: Not on file  Other Topics Concern  . Not on file  Social History Narrative  . Not on file      Review of Systems  All other systems reviewed and are negative.      Objective:   Physical Exam  Constitutional: He appears well-developed and well-nourished. No distress.  HENT:  Head: Normocephalic and atraumatic.  Right Ear: External ear normal.  Left Ear: External ear normal.  Nose: Nose normal.  Mouth/Throat: No oral lesions. No trismus in the jaw. No uvula swelling. Posterior oropharyngeal edema present. No oropharyngeal exudate, posterior oropharyngeal erythema or tonsillar abscesses.    Eyes: Conjunctivae are  normal.  Neck: Neck supple. No tracheal deviation present. No thyromegaly present.  Cardiovascular: Normal rate, regular rhythm and normal heart sounds.  Pulmonary/Chest: Effort normal and breath sounds normal.  Abdominal: Soft. Bowel sounds are normal. He exhibits no distension. There is no tenderness. There is no guarding.  Lymphadenopathy:    He has no cervical adenopathy.  Skin: He is not diaphoretic.  Vitals reviewed.         Assessment & Plan:  Pharyngoesophageal dysphagia  Patient does have prominent tonsils as demonstrated and drawn in physical exam section.  However where he is pointing in discussing difficulty swallowing seems to be more pronounced in the upper esophagus.  As stated above I question if his acid reflux may have triggered or exacerbated his eosinophilic esophagitis.  He is already on Augmentin and has taken a shot of Rocephin with no change.  His physical exam does not suggest underlying infection.  I see no evidence of a retropharyngeal or peritonsillar abscess he denies any fever pain.  Therefore, I recommended that he continue the antibiotic started by another physician but I would add prednisone for swelling and edema in his upper esophagus.  I recommended that he increase his PPI to twice daily and recheck on Monday.  If symptoms are continuing, I believe he would benefit from direct laryngoscopy through ENT to rule out obstruction or mass however the patient denies choking or retaining any food after swallowing.

## 2017-12-19 ENCOUNTER — Other Ambulatory Visit: Payer: Self-pay | Admitting: Family Medicine

## 2017-12-19 ENCOUNTER — Ambulatory Visit (INDEPENDENT_AMBULATORY_CARE_PROVIDER_SITE_OTHER): Payer: Federal, State, Local not specified - PPO | Admitting: Family Medicine

## 2017-12-19 DIAGNOSIS — G510 Bell's palsy: Secondary | ICD-10-CM | POA: Diagnosis not present

## 2017-12-19 MED ORDER — PREDNISONE 20 MG PO TABS: 60.0000 mg | ORAL_TABLET | Freq: Every day | ORAL | 0 refills | Status: DC

## 2017-12-19 MED ORDER — VALACYCLOVIR HCL 1 G PO TABS: 1000.0000 mg | ORAL_TABLET | Freq: Three times a day (TID) | ORAL | 0 refills | Status: DC

## 2017-12-19 NOTE — Progress Notes (Signed)
Subjective:    Patient ID: Gregory Lyons, male    DOB: [birth year], 42 y.o.   MRN: [id number]  HPI Patient is a 42 year old Caucasian male who believes symptoms started yesterday.  He noticed some weakness in his right side of his face yesterday morning.  He attributed to his CPAP machine possibly wearing the mask too tight.  This gradually progressed to the yesterday with some weakness in the muscles around his right eye and the inability to close his eye tightly as well as some itching and dry sensation.  This morning it became profoundly noticeable.  The patient has a right-sided facial droop.  When I asked him to smile, he is unable to curl the entire right side of his mouth.  He is unable to close his right eye tightly although he can bring his lids together.  I can easily separate the lids.  He is unable to furrow his brow on the right side with a sharp demarcation in the middle of his forehead.  He also reports decreased sense of taste or a metallic taste in his mouth.  However he has full range of motion of his tongue.  The remainder of his cranial nerves II through XII are intact.  This seems to be an isolated 7th nerve palsy.  He has full extraocular movements.  He denies any numbness in the right side of his face.  He has normal strength in his right upper and lower extremities.  He has normal grip strength.  He denies any numbness in the right side of his body.  He denies any headache. Denies any blurry vision. Past Medical History:  Diagnosis Date  . Complication of anesthesia    pt states gets mean   . Sleep apnea    pt states does not use CPAP pt scored 5 on stop bang tool results sent to PCP    Past Surgical History:  Procedure Laterality Date  . ESOPHAGEAL DILATION N/A 05/19/2016   Procedure: ESOPHAGEAL DILATION;  Surgeon: West Bali, MD;  Location: [facility];  Service: Endoscopy;  Laterality: N/A;  . ESOPHAGOGASTRODUODENOSCOPY N/A 05/19/2016   Procedure:  ESOPHAGOGASTRODUODENOSCOPY (EGD);  Surgeon: West Bali, MD;  [facility]: [facility];  Service: Endoscopy;  Laterality: N/A;  . KNEE ARTHROSCOPY Right 03/12/2014   Procedure: ARTHROSCOPY RIGHT KNEE WITH MENISCAL DEBRIDEMENT condroplasty;  Surgeon: Loanne Drilling, MD;  Location: [facility];  Service: Orthopedics;  Laterality: Right;  . right knee arthroscopy     12 to 13 years ago   . TENDON REPAIR     left hand 2007    Current Outpatient Medications on File Prior to Visit  Medication Sig Dispense Refill  . amoxicillin-clavulanate (AUGMENTIN) 875-125 MG tablet Take 1 tablet by mouth 2 (two) times daily. 20 tablet 0  . celecoxib (CELEBREX) 200 MG capsule Take 1 capsule (200 mg total) by mouth daily. 90 capsule 3  . omeprazole (PRILOSEC) 40 MG capsule TAKE 1 CAPSULE BY MOUTH EVERY DAY 90 capsule 4  . predniSONE (DELTASONE) 20 MG tablet 3 tabs poqday 1-2, 2 tabs poqday 3-4, 1 tab poqday 5-6 12 tablet 0  . predniSONE (DELTASONE) 20 MG tablet Take 3 tablets (60 mg total) by mouth daily with breakfast. 21 tablet 0  . valACYclovir (VALTREX) 1000 MG tablet Take 1 tablet (1,000 mg total) by mouth 3 (three) times daily. 21 tablet 0   No current facility-administered medications on file prior to visit.    No Known Allergies  Social History   Socioeconomic History  . Marital status: Married    Spouse name: Not on file  . Number of children: Not on file  . Years of education: Not on file  . Highest education level: Not on file  Occupational History  . Not on file  Social Needs  . Financial resource strain: Not on file  . Food insecurity:    Worry: Not on file    Inability: Not on file  . Transportation needs:    Medical: Not on file    Non-medical: Not on file  Tobacco Use  . Smoking status: Former Smoker    Packs/day: 1.00    Years: 30.00    Pack years: 30.00    Types: Cigarettes    Last attempt to quit: 02/15/2008    Years since quitting: 9.8  . Smokeless tobacco: Current User     Types: Chew  Substance and Sexual Activity  . Alcohol use: Yes    Comment: occas beer   . Drug use: No  . Sexual activity: Not on file  Lifestyle  . Physical activity:    Days per week: Not on file    Minutes per session: Not on file  . Stress: Not on file  Relationships  . Social connections:    Talks on phone: Not on file    Gets together: Not on file    Attends religious service: Not on file    Active member of club or organization: Not on file    Attends meetings of clubs or organizations: Not on file    Relationship status: Not on file  . Intimate partner violence:    Fear of current or ex partner: Not on file    Emotionally abused: Not on file    Physically abused: Not on file    Forced sexual activity: Not on file  Other Topics Concern  . Not on file  Social History Narrative  . Not on file      Review of Systems  All other systems reviewed and are negative.      Objective:   Physical Exam  Constitutional: He is oriented to person, place, and time. He appears well-developed and well-nourished.  HENT:  Head: Normocephalic and atraumatic.  Nose: Nose normal.  Mouth/Throat: Oropharynx is clear and moist. No oropharyngeal exudate.  Eyes: Pupils are equal, round, and reactive to light. Conjunctivae and EOM are normal.  Cardiovascular: Normal rate and regular rhythm.  Pulmonary/Chest: Effort normal and breath sounds normal. No stridor. No respiratory distress. He has no wheezes. He has no rales.  Neurological: He is alert and oriented to person, place, and time. He displays normal reflexes. A cranial nerve deficit is present. No sensory deficit. He exhibits normal muscle tone. Coordination normal.  Vitals reviewed.         Assessment & Plan:  Bell's palsy  Patient appears to have Bell's palsy/isolated 7th cranial nerve palsy on the right side.  I believe this given the fact that the forehead is involved as well as the muscles surrounding the right eye.   Therefore I will treat the patient with 60 mg of prednisone daily for 1 week followed by Valtrex 1000 mg 3 times daily for 1 week.  Monitor closely for any other symptoms.  If there are any signs of progression or involvement of other areas of the body, he is to be rechecked immediately or seek medical attention immediately.  I explained to the patient that an isolated Bell's  palsy should not progress to any other area of his body.

## 2018-02-28 ENCOUNTER — Ambulatory Visit: Payer: Federal, State, Local not specified - PPO | Admitting: Gastroenterology

## 2018-02-28 ENCOUNTER — Encounter

## 2018-02-28 ENCOUNTER — Encounter: Payer: Self-pay | Admitting: Gastroenterology

## 2018-02-28 DIAGNOSIS — K219 Gastro-esophageal reflux disease without esophagitis: Secondary | ICD-10-CM | POA: Diagnosis not present

## 2018-02-28 DIAGNOSIS — R1310 Dysphagia, unspecified: Secondary | ICD-10-CM | POA: Insufficient documentation

## 2018-02-28 NOTE — Assessment & Plan Note (Signed)
SYMPTOMS CONTROLLED/RESOLVED AND MAY BE RELATED TO MEDS.  DRINK WATER TO KEEP YOUR URINE LIGHT YELLOW. FOLLOW UP IN 1 YEAR OR IF YOUR SWALLOWING PROBLEMS RETURN.

## 2018-02-28 NOTE — Assessment & Plan Note (Signed)
SYMPTOMS FAIRLY WELL CONTROLLED.  DRINK WATER TO KEEP YOUR URINE LIGHT YELLOW. AVOID REFLUX TRIGGERS.  HANDOUT GIVEN. FOLLOW A LOW FAT DIET. Marland KitchenMEATS SHOULD BE BAKED, BROILED, OR BOILED. AVOID FRIED FOODS.  HANDOUT GIVEN. CONTINUE OMEPRAZOLE AT 20 MG OR 40 MG.  TAKE 30 MINUTES PRIOR TO YOUR FIRST MEAL. FOLLOW UP IN 1 YEAR OR IF YOUR SWALLOWING PROBLEMS RETURN.

## 2018-02-28 NOTE — Patient Instructions (Signed)
DRINK WATER TO KEEP YOUR URINE LIGHT YELLOW.  AVOID REFLUX TRIGGERS. SEE INFO BELOW.  FOLLOW A LOW FAT DIET. Marland Kitchen.MEATS SHOULD BE BAKED, BROILED, OR BOILED. AVOID FRIED FOODS. SEE INFO BELOW.  CONTINUE OMEPRAZOLE AT 20 MG OR 40 MG.  TAKE 30 MINUTES PRIOR TO YOUR FIRST MEAL.  FOLLOW UP IN 1 YEAR OR IF YOUR SWALLOWING PROBLEMS RETURN.    Lifestyle and home remedies TO MANAGE REFLUX/CHEST PAIN  You may eliminate or reduce the frequency of heartburn by making the following lifestyle changes:  . Control your weight. Being overweight is a major risk factor for heartburn and GERD. Excess pounds put pressure on your abdomen, pushing up your stomach and causing acid to back up into your esophagus.   . Eat smaller meals. 4 TO 6 MEALS A DAY. This reduces pressure on the lower esophageal sphincter, helping to prevent the valve from opening and acid from washing back into your esophagus.   Allena Earing. Loosen your belt. Clothes that fit tightly around your waist put pressure on your abdomen and the lower esophageal sphincter.   . Eliminate heartburn triggers. Everyone has specific triggers. Common triggers such as fatty or fried foods, spicy food, tomato sauce, carbonated beverages, alcohol, chocolate, mint, garlic, onion, caffeine and nicotine may make heartburn worse.   Marland Kitchen. Avoid stooping or bending. Tying your shoes is OK. Bending over for longer periods to weed your garden isn't, especially soon after eating.   . Don't lie down after a meal. Wait at least three to four hours after eating before going to bed, and don't lie down right after eating.   Marland Kitchen. PUT THE HEAD OF YOUR BED ON 6 INCH BLOCKS.   Alternative medicine . Several home remedies exist for treating GERD, but they provide only temporary relief. They include drinking baking soda (sodium bicarbonate) added to water or drinking other fluids such as baking soda mixed with cream of tartar and water.  . Although these liquids create temporary relief by  neutralizing, washing away or buffering acids, eventually they aggravate the situation by adding gas and fluid to your stomach, increasing pressure and causing more acid reflux. Further, adding more sodium to your diet may increase your blood pressure and add stress to your heart, and excessive bicarbonate ingestion can alter the acid-base balance in your body.  . Low-Fat Diet . BREADS, CEREALS, PASTA, RICE, DRIED PEAS, AND BEANS . These products are high in carbohydrates and most are low in fat. Therefore, they can be increased in the diet as substitutes for fatty foods. They too, however, contain calories and should not be eaten in excess. Cereals can be eaten for snacks as well as for breakfast.  .  . FRUITS AND VEGETABLES . It is good to eat fruits and vegetables. Besides being sources of fiber, both are rich in vitamins and some minerals. They help you get the daily allowances of these nutrients. Fruits and vegetables can be used for snacks and desserts. .  . MEATS . Limit lean meat, chicken, [place]key, and fish to no more than 6 ounces per day. . Beef, Pork, and Lamb . Use lean cuts of beef, pork, and lamb. Lean cuts include:  Marland Kitchen. Extra-lean ground beef.  . Arm roast.  . Sirloin tip.  . Center-cut ham.  . Round steak.  . Loin chops.  . Rump roast.  . Tenderloin.  Wyvonnia Lora. Trim all fat off the outside of meats before cooking. It is not necessary to severely decrease the intake of  red meat, but lean choices should be made. Lean meat is rich in protein and contains a highly absorbable form of iron. Premenopausal women, in particular, should avoid reducing lean red meat because this could increase the risk for low red blood cells (iron-deficiency anemia). .  . Chicken and [place] . These are good sources of protein. The fat of poultry can be reduced by removing the skin and underlying fat layers before cooking. Chicken and [place] can be substituted for lean red meat in the diet. Poultry should not be  fried or covered with high-fat sauces. . Fish and Shellfish . Fish is a good source of protein. Shellfish contain cholesterol, but they usually are low in saturated fatty acids. The preparation of fish is important. Like chicken and [place], they should not be fried or covered with high-fat sauces. . EGGS . Egg whites contain no fat or cholesterol. They can be eaten often. Try 1 to 2 egg whites instead of whole eggs in recipes or use egg substitutes that do not contain yolk. Marland Kitchen. MILK AND DAIRY PRODUCTS . Use skim or 1% milk instead of 2% or whole milk. Decrease whole milk, natural, and processed cheeses. Use nonfat or low-fat (2%) cottage cheese or low-fat cheeses made from vegetable oils. Choose nonfat or low-fat (1 to 2%) yogurt. Experiment with evaporated skim milk in recipes that call for heavy cream. Substitute low-fat yogurt or low-fat cottage cheese for sour cream in dips and salad dressings. Have at least 2 servings of low-fat dairy products, such as 2 glasses of skim (or 1%) milk each day to help get your daily calcium intake. Marland Kitchen. FATS AND OILS . Reduce the total intake of fats, especially saturated fat. Butterfat, lard, and beef fats are high in saturated fat and cholesterol. These should be avoided as much as possible. Vegetable fats do not contain cholesterol, but certain vegetable fats, such as coconut oil, palm oil, and palm kernel oil are very high in saturated fats. These should be limited. These fats are often used in bakery goods, processed foods, popcorn, oils, and nondairy creamers. Vegetable shortenings and some peanut butters contain hydrogenated oils, which are also saturated fats. Read the labels on these foods and check for saturated vegetable oils. . Unsaturated vegetable oils and fats do not raise blood cholesterol. However, they should be limited because they are fats and are high in calories. Total fat should still be limited to 30% of your daily caloric intake. Desirable liquid  vegetable oils are corn oil, cottonseed oil, olive oil, canola oil, safflower oil, soybean oil, and sunflower oil. Peanut oil is not as good, but small amounts are acceptable. Buy a heart-healthy tub margarine that has no partially hydrogenated oils in the ingredients. Mayonnaise and salad dressings often are made from unsaturated fats, but they should also be limited because of their high calorie and fat content. . Seeds, nuts, peanut butter, olives, and avocados are high in fat, but the fat is mainly the unsaturated type. These foods should be limited mainly to avoid excess calories and fat. . OTHER EATING TIPS . Snacks  . Most sweets should be limited as snacks. They tend to be rich in calories and fats, and their caloric content outweighs their nutritional value. Some good choices in snacks are graham crackers, melba toast, soda crackers, bagels (no egg), English muffins, fruits, and vegetables. These snacks are preferable to snack crackers, [place] fries, TORTILLA CHIPS, and POTATO chips. Popcorn should be air-popped or cooked in small amounts of  liquid vegetable oil. . Desserts . Eat fruit, low-fat yogurt, and fruit ices instead of pastries, cake, and cookies. Sherbet, angel food cake, gelatin dessert, frozen low-fat yogurt, or other frozen products that do not contain saturated fat (pure fruit juice bars, frozen ice pops) are also acceptable.  . COOKING METHODS . Choose those methods that use little or no fat. They include: . Poaching.  . Braising.  . Steaming.  Pascal Lux.  . Baking.  . Stir-frying.  . Broiling.  . Microwaving.  . Foods can be cooked in a nonstick pan without added fat, or use a nonfat cooking spray in regular cookware. Limit fried foods and avoid frying in saturated fat. Add moisture to lean meats by using water, broth, cooking wines, and other nonfat or low-fat sauces along with the cooking methods mentioned above. Marland Kitchen Soups and stews should be chilled after cooking. The fat  that forms on top after a few hours in the refrigerator should be skimmed off. When preparing meals, avoid using excess salt. Salt can contribute to raising blood pressure in some people. .  . EATING AWAY FROM HOME . Order entres, potatoes, and vegetables without sauces or butter. When meat exceeds the size of a deck of cards (3 to 4 ounces), the rest can be taken home for another meal. . Choose vegetable or fruit salads and ask for low-calorie salad dressings to be served on the side. Use dressings sparingly. Limit high-fat toppings, such as bacon, crumbled eggs, cheese, sunflower seeds, and olives. Ask for heart-healthy tub margarine instead of butter.

## 2018-02-28 NOTE — Progress Notes (Signed)
   Subjective:    Patient ID: Gregory Lyons, male    DOB: May 06, 1975, 43 y.o.   MRN: [id number]  Donita Brooks, MD  HPI Gave up TOBACCO FOR PAST YEAR. DRINKS A LOT OF CAFFEINE. TRYING TO LOSE WEIGHT. RIGHT NOW NOT EATING LIKE HE SHOULD, NOT REGULAR. SWALLOWING BEEN OK SINCE OCT/NOV 2019.  HAD A BOUT OF DIVERTICULITIS AFTER SUNFLOWER SEEDS. HAD ABX AND NO IMAGING. DRUGS WORKED AND  NOW NO SYMPTOMS. HAD BELL'S PALSY IN NOV 2019. HEARTBURN: RARE(2-3X/YEAR). BMs: 2-3/DAY NL.TAKING OMEPRAZOLE FAITHFULLY. LAST EGD/DIL APR 2018. DOESN'T WANT TO TAKE OMEPRAZOLE FOREVER.    PT DENIES FEVER, CHILLS, HEMATOCHEZIA, HEMATEMESIS, nausea, vomiting, melena, diarrhea, CHEST PAIN, SHORTNESS OF BREATH,  CHANGE IN BOWEL IN HABITS, constipation, abdominal pain, problems swallowing, OR heartburn or indigestion.  Past Medical History:  Diagnosis Date  . Complication of anesthesia    pt states gets mean   . GERD without esophagitis   . Sleep apnea    pt states does not use CPAP pt scored 5 on stop bang tool results sent to PCP    Past Surgical History:  Procedure Laterality Date  . ESOPHAGEAL DILATION N/A 05/19/2016     . ESOPHAGOGASTRODUODENOSCOPY N/A 05/19/2016     . KNEE ARTHROSCOPY Right 03/12/2014     . right knee arthroscopy     12 to 13 years ago   . TENDON REPAIR     left hand 2007    No Known Allergies  Current Outpatient Medications  Medication Sig    . celecoxib (CELEBREX) 200 MG capsule Take 1 capsule (200 mg total) by mouth daily.    Marland Kitchen omeprazole (PRILOSEC) 40 MG capsule TAKE 1 CAPSULE BY MOUTH EVERY DAY    .      .      .      .       Review of Systems PER HPI OTHERWISE ALL SYSTEMS ARE NEGATIVE.    Objective:   Physical Exam Vitals signs reviewed.  Constitutional:      General: He is not in acute distress.    Appearance: He is well-developed.  HENT:     Head: Normocephalic and atraumatic.     Mouth/Throat:     Pharynx: No oropharyngeal exudate.  Eyes:     General: No  scleral icterus.    Pupils: Pupils are equal, round, and reactive to light.  Neck:     Musculoskeletal: Normal range of motion and neck supple.  Cardiovascular:     Rate and Rhythm: Normal rate and regular rhythm.     Heart sounds: Normal heart sounds.  Pulmonary:     Effort: Pulmonary effort is normal. No respiratory distress.     Breath sounds: Normal breath sounds.  Abdominal:     General: Bowel sounds are normal. There is no distension.     Palpations: Abdomen is soft.     Tenderness: There is no abdominal tenderness.  Lymphadenopathy:     Cervical: No cervical adenopathy.  Neurological:     Mental Status: He is alert and oriented to person, place, and time. Mental status is at baseline.     Comments: NO FOCAL DEFICITS  Psychiatric:        Behavior: Behavior normal.        Thought Content: Thought content normal.     Comments: SLIGHTLY ANXIOUS MOOD, NL AFFECT      Assessment & Plan:

## 2018-02-28 NOTE — Progress Notes (Signed)
cc'ed to pcp °

## 2018-03-01 NOTE — Progress Notes (Signed)
ON RECALL  °

## 2018-05-01 ENCOUNTER — Other Ambulatory Visit: Payer: Self-pay | Admitting: *Deleted

## 2018-05-01 MED ORDER — OMEPRAZOLE 40 MG PO CPDR: 40.0000 mg | DELAYED_RELEASE_CAPSULE | Freq: Two times a day (BID) | ORAL | 3 refills | Status: DC

## 2018-05-30 ENCOUNTER — Ambulatory Visit (INDEPENDENT_AMBULATORY_CARE_PROVIDER_SITE_OTHER): Payer: Federal, State, Local not specified - PPO | Admitting: Family Medicine

## 2018-05-30 ENCOUNTER — Encounter: Payer: Self-pay | Admitting: Family Medicine

## 2018-05-30 ENCOUNTER — Encounter (INDEPENDENT_AMBULATORY_CARE_PROVIDER_SITE_OTHER): Payer: Self-pay

## 2018-05-30 ENCOUNTER — Other Ambulatory Visit: Payer: Self-pay

## 2018-05-30 DIAGNOSIS — J302 Other seasonal allergic rhinitis: Secondary | ICD-10-CM

## 2018-05-30 DIAGNOSIS — J0141 Acute recurrent pansinusitis: Secondary | ICD-10-CM

## 2018-05-30 MED ORDER — MONTELUKAST SODIUM 10 MG PO TABS: 10.0000 mg | ORAL_TABLET | Freq: Every day | ORAL | 1 refills | Status: DC

## 2018-05-30 MED ORDER — AMOXICILLIN-POT CLAVULANATE 875-125 MG PO TABS: 1.0000 | ORAL_TABLET | Freq: Two times a day (BID) | ORAL | 0 refills | Status: AC

## 2018-05-30 NOTE — Progress Notes (Signed)
Virtual Visit via Telephone Note  Phone visit arranged with Gregory Lyons for 05/30/18 at 11:45 AM EDT  Services provided today were via telemedicine through telephone call. Start of phone call:  11:55 AM I verified that I was speaking with the correct person using two identifiers. Patient reported their location during encounter was working Patient consented to telephone visit  I conducted telephone visit from [facility]  Referring Provider:   Donita Brooks, MD    All participants in encounter: Myself and pt   I discussed the limitations, risks, security and privacy concerns of performing an evaluation and management service by telephone and the availability of in person appointments. I also discussed with the patient that there may be a patient responsible charge related to this service. The patient expressed understanding and agreed to proceed.   History of Present Illness: Sinusitis  This is a recurrent problem. Episode onset: 7-8 days. The problem has been gradually worsening since onset. There has been no fever. The pain is severe. Associated symptoms include headaches (to face/forehead), sinus pressure and sneezing. Pertinent negatives include no chills, diaphoresis, ear pain, hoarse voice, neck pain or sore throat. (Itchy eyes) Past treatments include nothing.  Also has hx of seasonal allergies during this time of year and pollen is especially irritating to eyes and sinuses - no meds tried.    Observations/Objective: Limited due to telephone visit, no coughing, no audible wheeze or stridor, phonation sounded stuffy/nasal but clear     Assessment and Plan:    ICD-10-CM   1. Acute recurrent pansinusitis J01.41    8+ days of sx, worsening, tx for ABS  2. Seasonal allergies J30.2    instructed to take antihistamine, flonase and singulair during seasons of pollen and severe allergies    Follow Up Instructions: F/up as needed   I discussed  the assessment and treatment plan with the patient. The patient was provided an opportunity to ask questions and all were answered. The patient agreed with the plan and demonstrated an understanding of the instructions.   The patient was advised to call back or seek an in-person evaluation if the symptoms worsen or if the condition fails to improve as anticipated.    Phone call concluded at 12:02 I provided 7 minutes of non-face-to-face time during this encounter.   Danelle Berry, PA-C

## 2018-06-04 ENCOUNTER — Ambulatory Visit (INDEPENDENT_AMBULATORY_CARE_PROVIDER_SITE_OTHER): Payer: Federal, State, Local not specified - PPO | Admitting: Family Medicine

## 2018-06-04 ENCOUNTER — Other Ambulatory Visit: Payer: Self-pay

## 2018-06-04 ENCOUNTER — Other Ambulatory Visit: Payer: Self-pay | Admitting: Family Medicine

## 2018-06-04 ENCOUNTER — Ambulatory Visit
Admission: RE | Admit: 2018-06-04 | Discharge: 2018-06-04 | Disposition: A | Discharge status: Home / Self Care | Payer: Federal, State, Local not specified - PPO | Source: Ambulatory Visit | Attending: Family Medicine | Admitting: Family Medicine

## 2018-06-04 ENCOUNTER — Encounter: Payer: Self-pay | Admitting: Family Medicine

## 2018-06-04 VITALS — BP 110/74 | HR 72 | Temp 98.1°F | Wt 270.0 lb

## 2018-06-04 DIAGNOSIS — M79671 Pain in right foot: Secondary | ICD-10-CM

## 2018-06-04 DIAGNOSIS — M25572 Pain in left ankle and joints of left foot: Secondary | ICD-10-CM | POA: Diagnosis not present

## 2018-06-04 LAB — BASIC METABOLIC PANEL WITH GFR
BUN: 16 mg/dL (ref 7–25)
CO2: 29 mmol/L (ref 20–32)
Calcium: 9.1 mg/dL (ref 8.6–10.3)
Chloride: 103 mmol/L (ref 98–110)
Creat: 1 mg/dL (ref 0.60–1.35)
GFR, Est African American: 105 mL/min/{1.73_m2} (ref >=60)
GFR, Est Non African American: 90 mL/min/{1.73_m2} (ref >=60)
Glucose, Bld: 91 mg/dL (ref 65–99)
Potassium: 4.2 mmol/L (ref 3.5–5.3)
Sodium: 140 mmol/L (ref 135–146)

## 2018-06-04 LAB — URIC ACID: Uric Acid, Serum: 6.1 mg/dL (ref 4.0–8.0)

## 2018-06-04 MED ORDER — PREDNISONE 20 MG PO TABS: ORAL_TABLET | ORAL | 0 refills | Status: DC

## 2018-06-04 NOTE — Progress Notes (Signed)
Subjective:    Patient ID: Gregory Lyons, male    DOB: 1975-05-10, 43 y.o.   MRN: [id number]  HPI  Patient is a 43 year old white male who works with the [employer].  Patient denies any injury.  He states that his foot felt fine up until Saturday morning.  Saturday morning he was tightening the shoestrings on his boot when he suddenly felt pain in the ball of his foot and his midfoot.  He denies any injury however it immediately felt sore.  Yesterday the pain intensified.  He has pain and visible swelling over the first MTP joint.  He is also tender to palpation over the body of the fifth metatarsal.  However he indicates that the majority the pain is in the middle of the second third and fourth metatarsals.  There is swelling on the dorsum and plantar aspect of his midfoot.  There is no visible ecchymosis or erythema however it is warm to the touch and tender to touch.  Patient denies any known mechanism of injury however yesterday while trying to put weight on his foot he did hear an audible crunch. Past Medical History:  Diagnosis Date  . Complication of anesthesia    pt states gets mean   . GERD without esophagitis   . Sleep apnea    pt states does not use CPAP pt scored 5 on stop bang tool results sent to PCP    Past Surgical History:  Procedure Laterality Date  . ESOPHAGEAL DILATION N/A 05/19/2016   Procedure: ESOPHAGEAL DILATION;  Surgeon: West Bali, MD;  Location: [facility];  Service: Endoscopy;  Laterality: N/A;  . ESOPHAGOGASTRODUODENOSCOPY N/A 05/19/2016   Procedure: ESOPHAGOGASTRODUODENOSCOPY (EGD);  Surgeon: West Bali, MD;  Location: [facility];  Service: Endoscopy;  Laterality: N/A;  . KNEE ARTHROSCOPY Right 03/12/2014   Procedure: ARTHROSCOPY RIGHT KNEE WITH MENISCAL DEBRIDEMENT condroplasty;  Surgeon: Loanne Drilling, MD;  Location: [facility];  Service: Orthopedics;  Laterality: Right;  . right knee arthroscopy     12 to 13 years ago   . TENDON REPAIR     left hand 2007    Current Outpatient Medications on File Prior to Visit  Medication Sig Dispense Refill  . amoxicillin-clavulanate (AUGMENTIN) 875-125 MG tablet Take 1 tablet by mouth 2 (two) times daily for 7 days. 14 tablet 0  . montelukast (SINGULAIR) 10 MG tablet Take 1 tablet (10 mg total) by mouth at bedtime. 30 tablet 1  . omeprazole (PRILOSEC) 40 MG capsule Take 1 capsule (40 mg total) by mouth 2 (two) times daily. 180 capsule 3   No current facility-administered medications on file prior to visit.    No Known Allergies Social History   Socioeconomic History  . Marital status: Married    Spouse name: Not on file  . Number of children: Not on file  . Years of education: Not on file  . Highest education level: Not on file  Occupational History  . Not on file  Social Needs  . Financial resource strain: Not on file  . Food insecurity:    Worry: Not on file    Inability: Not on file  . Transportation needs:    Medical: Not on file    Non-medical: Not on file  Tobacco Use  . Smoking status: Former Smoker    Packs/day: 1.00    Years: 30.00    Pack years: 30.00    Types: Cigarettes    Last attempt to quit:  02/15/2008    Years since quitting: 10.3  . Smokeless tobacco: Former NeurosurgeonUser    Types: Chew  Substance and Sexual Activity  . Alcohol use: Yes    Comment: occas beer   . Drug use: No  . Sexual activity: Not on file  Lifestyle  . Physical activity:    Days per week: Not on file    Minutes per session: Not on file  . Stress: Not on file  Relationships  . Social connections:    Talks on phone: Not on file    Gets together: Not on file    Attends religious service: Not on file    Active member of club or organization: Not on file    Attends meetings of clubs or organizations: Not on file    Relationship status: Not on file  . Intimate partner violence:    Fear of current or ex partner: Not on file    Emotionally abused: Not on file    Physically abused: Not on  file    Forced sexual activity: Not on file  Other Topics Concern  . Not on file  Social History Narrative  . Not on file     Review of Systems  All other systems reviewed and are negative.      Objective:   Physical Exam  Cardiovascular: Normal rate, regular rhythm and normal heart sounds.  Pulmonary/Chest: Effort normal and breath sounds normal.  Musculoskeletal:     Right foot: Tenderness, bony tenderness and swelling present.       Feet:  Vitals reviewed.         A/P Right foot pain - Plan: DG Foot Complete Right  Mechanism of injury is uncertain.  Given the sudden onset of severe pain in the midfoot, gout is on the differential diagnosis.  However the patient denies any history of gout or any family history of gout.  Also on the differential diagnosis would be a stress fracture of the midfoot.  Given the swelling and pain I believe this is the most likely explanation.  I will send the patient for an x-ray to determine the extent of the injury.  If the x-ray does show a fracture, I would recommend a cam walker and nonweightbearing for at least 1 week and then weightbearing as tolerated plus the cam walker for 4 weeks total then repeat x-rays.  If there is no visible fracture on the x-ray, consider an empiric course of treatment for gout to see if the pain will improve

## 2018-06-04 NOTE — Addendum Note (Signed)
Addended by: Delbert Harness on: 06/04/2018 11:23 AM   Modules accepted: Orders

## 2018-11-21 ENCOUNTER — Other Ambulatory Visit: Payer: Self-pay

## 2018-11-21 ENCOUNTER — Other Ambulatory Visit: Payer: Federal, State, Local not specified - PPO

## 2018-11-21 DIAGNOSIS — Z0000 Encounter for general adult medical examination without abnormal findings: Secondary | ICD-10-CM

## 2018-11-22 LAB — CBC WITH DIFFERENTIAL/PLATELET
Absolute Monocytes: 544 cells/uL (ref 200–950)
Basophils Absolute: 29 cells/uL (ref 0–200)
Basophils Relative: 0.6 %
Eosinophils Absolute: 240 cells/uL (ref 15–500)
Eosinophils Relative: 4.9 %
HCT: 44.7 % (ref 38.5–50.0)
Hemoglobin: 15.3 g/dL (ref 13.2–17.1)
Lymphs Abs: 1730 cells/uL (ref 850–3900)
MCH: 31 pg (ref 27.0–33.0)
MCHC: 34.2 g/dL (ref 32.0–36.0)
MCV: 90.7 fL (ref 80.0–100.0)
MPV: 12.2 fL (ref 7.5–12.5)
Monocytes Relative: 11.1 %
Neutro Abs: 2357 cells/uL (ref 1500–7800)
Neutrophils Relative %: 48.1 %
Platelets: 224 10*3/uL (ref 140–400)
RBC: 4.93 10*6/uL (ref 4.20–5.80)
RDW: 12.4 % (ref 11.0–15.0)
Total Lymphocyte: 35.3 %
WBC: 4.9 10*3/uL (ref 3.8–10.8)

## 2018-11-22 LAB — COMPREHENSIVE METABOLIC PANEL
AG Ratio: 1.8 (calc) (ref 1.0–2.5)
ALT: 43 U/L (ref 9–46)
AST: 27 U/L (ref 10–40)
Albumin: 4.4 g/dL (ref 3.6–5.1)
Alkaline phosphatase (APISO): 47 U/L (ref 36–130)
BUN: 15 mg/dL (ref 7–25)
CO2: 31 mmol/L (ref 20–32)
Calcium: 9.5 mg/dL (ref 8.6–10.3)
Chloride: 104 mmol/L (ref 98–110)
Creat: 1.07 mg/dL (ref 0.60–1.35)
Globulin: 2.5 g/dL (calc) (ref 1.9–3.7)
Glucose, Bld: 102 mg/dL — ABNORMAL HIGH (ref 65–99)
Potassium: 4.6 mmol/L (ref 3.5–5.3)
Sodium: 140 mmol/L (ref 135–146)
Total Bilirubin: 0.6 mg/dL (ref 0.2–1.2)
Total Protein: 6.9 g/dL (ref 6.1–8.1)

## 2018-11-22 LAB — LIPID PANEL
Cholesterol: 198 mg/dL (ref <200)
HDL: 42 mg/dL (ref >=40)
LDL Cholesterol (Calc): 129 mg/dL (calc) — ABNORMAL HIGH
Non-HDL Cholesterol (Calc): 156 mg/dL (calc) — ABNORMAL HIGH (ref <130)
Total CHOL/HDL Ratio: 4.7 (calc) (ref <5.0)
Triglycerides: 154 mg/dL — ABNORMAL HIGH (ref <150)

## 2018-11-26 ENCOUNTER — Other Ambulatory Visit: Payer: Self-pay

## 2018-11-26 ENCOUNTER — Ambulatory Visit (INDEPENDENT_AMBULATORY_CARE_PROVIDER_SITE_OTHER): Payer: Federal, State, Local not specified - PPO | Admitting: Family Medicine

## 2018-11-26 VITALS — BP 118/80 | HR 74 | Temp 97.8°F | Resp 18 | Ht 72.0 in | Wt 273.0 lb

## 2018-11-26 DIAGNOSIS — Z0000 Encounter for general adult medical examination without abnormal findings: Secondary | ICD-10-CM

## 2018-11-26 MED ORDER — HYDROCODONE-ACETAMINOPHEN 5-325 MG PO TABS: 1.0000 | ORAL_TABLET | Freq: Four times a day (QID) | ORAL | 0 refills | Status: DC | PRN

## 2018-11-26 NOTE — Progress Notes (Signed)
Subjective:    Patient ID: Gregory Lyons, male    DOB: April 15, 1975, 43 y.o.   MRN: [id number]  HPI Patient presents today for complete physical exam.  His only concern is persistent pain in his ankle.  Pain is primarily located in his left ankle and left foot.  It is made worse by prolonged standing and walking.  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  At times the pain is severe and keeps him awake at night and is unrelieved by ibuprofen.  He is requesting a low-dose pain pill that he could take sparingly occasionally just for severe pain when it keeps him awake at night.  He does not abuse pain medication and seldom asked for anything for pain.  Therefore I have no concerns about him using a pain medication sparingly for the pain in his ankle.  He suspects that 30 pills will last him more than a year.  Otherwise he is doing well.  He is due for a flu shot.  He also appears to be due for a tetanus shot.  He declines both of these today.  He refuses vaccinations.  His most recent lab work is listed below.  He denies any family history of colon cancer or prostate cancer and therefore does not require colonoscopy or prostate exam until age 57. Lab on 11/21/2018  Component Date Value Ref Range Status  . Cholesterol 11/21/2018 198  <200 mg/dL Final  . HDL 11/21/2018 42  > OR = 40 mg/dL Final  . Triglycerides 11/21/2018 154* <150 mg/dL Final  . LDL Cholesterol (Calc) 11/21/2018 129* mg/dL (calc) Final   Comment: Reference range: <100 . Desirable range <100 mg/dL for primary prevention;   <70 mg/dL for patients with CHD or diabetic patients  with > or = 2 CHD risk factors. Marland Kitchen LDL-C is now calculated using the Martin-Hopkins  calculation, which is a validated novel method providing  better accuracy than the Friedewald equation in the  estimation of LDL-C.  Cresenciano Genre et al. Annamaria Helling. 7614;709(29): 2061-2068  (http://education.[facility].com/faq/FAQ164)    . Total CHOL/HDL Ratio 11/21/2018 4.7  <5.0 (calc) Final  . Non-HDL Cholesterol (Calc) 11/21/2018 156* <130 mg/dL (calc) Final   Comment: For patients with diabetes plus 1 major ASCVD risk  factor, treating to a non-HDL-C goal of <100 mg/dL  (LDL-C of <70 mg/dL) is considered a therapeutic  option.   . Glucose, Bld 11/21/2018 102* 65 - 99 mg/dL Final   Comment: .            Fasting reference interval . For someone without known diabetes, a glucose value between 100 and 125 mg/dL is consistent with prediabetes and should be confirmed with a follow-up test. .   . BUN 11/21/2018 15  7 - 25 mg/dL Final  . Creat 11/21/2018 1.07  0.60 - 1.35 mg/dL Final  . BUN/Creatinine Ratio 57/47/3403 NOT APPLICABLE  6 - 22 (calc) Final  . Sodium 11/21/2018 140  135 - 146 mmol/L Final  . Potassium 11/21/2018 4.6  3.5 - 5.3 mmol/L Final  . Chloride 11/21/2018 104  98 - 110 mmol/L Final  . CO2 11/21/2018 31  20 - 32 mmol/L Final  . Calcium 11/21/2018 9.5  8.6 - 10.3 mg/dL Final  . Total Protein 11/21/2018 6.9  6.1 - 8.1 g/dL Final  . Albumin 11/21/2018 4.4  3.6 - 5.1 g/dL Final  . Globulin 11/21/2018 2.5  1.9 - 3.7 g/dL (  calc) Final  . AG Ratio 11/21/2018 1.8  1.0 - 2.5 (calc) Final  . Total Bilirubin 11/21/2018 0.6  0.2 - 1.2 mg/dL Final  . Alkaline phosphatase (APISO) 11/21/2018 47  36 - 130 U/L Final  . AST 11/21/2018 27  10 - 40 U/L Final  . ALT 11/21/2018 43  9 - 46 U/L Final  . WBC 11/21/2018 4.9  3.8 - 10.8 Thousand/uL Final  . RBC 11/21/2018 4.93  4.20 - 5.80 Million/uL Final  . Hemoglobin 11/21/2018 15.3  13.2 - 17.1 g/dL Final  . HCT 02/58/5277 44.7  38.5 - 50.0 % Final  . MCV 11/21/2018 90.7  80.0 - 100.0 fL Final  . MCH 11/21/2018 31.0  27.0 - 33.0 pg Final  . MCHC 11/21/2018 34.2  32.0 - 36.0 g/dL Final  . RDW 82/42/3536 12.4  11.0 - 15.0 % Final  . Platelets 11/21/2018 224  140 - 400 Thousand/uL Final  . MPV 11/21/2018 12.2  7.5 - 12.5 fL Final  . Neutro Abs 11/21/2018 2,357   1,500 - 7,800 cells/uL Final  . Lymphs Abs 11/21/2018 1,730  850 - 3,900 cells/uL Final  . Absolute Monocytes 11/21/2018 544  200 - 950 cells/uL Final  . Eosinophils Absolute 11/21/2018 240  15 - 500 cells/uL Final  . Basophils Absolute 11/21/2018 29  0 - 200 cells/uL Final  . Neutrophils Relative % 11/21/2018 48.1  % Final  . Total Lymphocyte 11/21/2018 35.3  % Final  . Monocytes Relative 11/21/2018 11.1  % Final  . Eosinophils Relative 11/21/2018 4.9  % Final  . Basophils Relative 11/21/2018 0.6  % Final   Past Medical History:  Diagnosis Date  . Complication of anesthesia    pt states gets mean   . GERD without esophagitis   . Sleep apnea    pt states does not use CPAP pt scored 5 on stop bang tool results sent to PCP    Past Surgical History:  Procedure Laterality Date  . ESOPHAGEAL DILATION N/A 05/19/2016   Procedure: ESOPHAGEAL DILATION;  Surgeon: West Bali, MD;  Location: [facility];  Service: Endoscopy;  Laterality: N/A;  . ESOPHAGOGASTRODUODENOSCOPY N/A 05/19/2016   Procedure: ESOPHAGOGASTRODUODENOSCOPY (EGD);  Surgeon: West Bali, MD;  Location: [facility];  Service: Endoscopy;  Laterality: N/A;  . KNEE ARTHROSCOPY Right 03/12/2014   Procedure: ARTHROSCOPY RIGHT KNEE WITH MENISCAL DEBRIDEMENT condroplasty;  Surgeon: Loanne Drilling, MD;  Location: [facility];  Service: Orthopedics;  Laterality: Right;  . right knee arthroscopy     12 to 13 years ago   . TENDON REPAIR     left hand 2007    Current Outpatient Medications on File Prior to Visit  Medication Sig Dispense Refill  . omeprazole (PRILOSEC) 40 MG capsule Take 1 capsule (40 mg total) by mouth 2 (two) times daily. 180 capsule 3   No current facility-administered medications on file prior to visit.    No Known Allergies Social History   Socioeconomic History  . Marital status: Married    Spouse name: Not on file  . Number of children: Not on file  . Years of education: Not on file  . Highest education  level: Not on file  Occupational History  . Not on file  Social Needs  . Financial resource strain: Not on file  . Food insecurity    Worry: Not on file    Inability: Not on file  . Transportation needs    Medical: Not on file  Non-medical: Not on file  Tobacco Use  . Smoking status: Former Smoker    Packs/day: 1.00    Years: 30.00    Pack years: 30.00    Types: Cigarettes    Quit date: 02/15/2008    Years since quitting: 10.7  . Smokeless tobacco: Former Neurosurgeon    Types: Chew  Substance and Sexual Activity  . Alcohol use: Yes    Comment: occas beer   . Drug use: No  . Sexual activity: Not on file  Lifestyle  . Physical activity    Days per week: Not on file    Minutes per session: Not on file  . Stress: Not on file  Relationships  . Social Musician on phone: Not on file    Gets together: Not on file    Attends religious service: Not on file    Active member of club or organization: Not on file    Attends meetings of clubs or organizations: Not on file    Relationship status: Not on file  . Intimate partner violence    Fear of current or ex partner: Not on file    Emotionally abused: Not on file    Physically abused: Not on file    Forced sexual activity: Not on file  Other Topics Concern  . Not on file  Social History Narrative  . Not on file   No family history on file.' Dad has atrial fibrillation.  There is also remote family history of diabetes   Review of Systems  All other systems reviewed and are negative.      Objective:   Physical Exam Vitals signs reviewed.  Constitutional:      General: He is not in acute distress.    Appearance: He is obese. He is not ill-appearing, toxic-appearing or diaphoretic.  HENT:     Head: Normocephalic and atraumatic.     Right Ear: Tympanic membrane and ear canal normal. There is no impacted cerumen.     Left Ear: Tympanic membrane and ear canal normal. There is no impacted cerumen.     Nose: Nose  normal. No congestion or rhinorrhea.     Mouth/Throat:     Mouth: Mucous membranes are moist.     Pharynx: Oropharynx is clear. No oropharyngeal exudate or posterior oropharyngeal erythema.  Eyes:     General: No scleral icterus.       Right eye: No discharge.        Left eye: No discharge.     Extraocular Movements: Extraocular movements intact.     Conjunctiva/sclera: Conjunctivae normal.     Pupils: Pupils are equal, round, and reactive to light.  Neck:     Musculoskeletal: Normal range of motion and neck supple. No neck rigidity or muscular tenderness.     Vascular: No carotid bruit.  Cardiovascular:     Rate and Rhythm: Normal rate and regular rhythm.     Pulses: Normal pulses.     Heart sounds: Normal heart sounds. No murmur. No friction rub. No gallop.   Pulmonary:     Effort: Pulmonary effort is normal. No respiratory distress.     Breath sounds: Normal breath sounds. No stridor. No wheezing, rhonchi or rales.  Chest:     Chest wall: No tenderness.  Abdominal:     General: Abdomen is flat. Bowel sounds are normal. There is no distension.     Palpations: Abdomen is soft. There is no mass.  Tenderness: There is no abdominal tenderness. There is no right CVA tenderness, left CVA tenderness, guarding or rebound.     Hernia: No hernia is present.  Musculoskeletal:        General: Tenderness present. No deformity.     Right lower leg: No edema.     Left lower leg: No edema.  Lymphadenopathy:     Cervical: No cervical adenopathy.  Skin:    General: Skin is warm.     Coloration: Skin is not jaundiced.     Findings: No bruising, erythema, lesion or rash.  Neurological:     General: No focal deficit present.     Mental Status: He is alert and oriented to person, place, and time. Mental status is at baseline.     Cranial Nerves: No cranial nerve deficit.     Sensory: No sensory deficit.     Motor: No weakness.     Coordination: Coordination normal.     Gait: Gait normal.   Psychiatric:        Mood and Affect: Mood normal.        Behavior: Behavior normal.        Thought Content: Thought content normal.        Judgment: Judgment normal.           Assessment & Plan:  General medical exam  Patient's blood pressure today is excellent.  However his BMI is elevated.  I have recommended 30 minutes to 1 hour a day of aerobic exercise.  I recommended gradual weight loss.  Ideally I would like to see his weight close to 200 pounds for his height.  His blood sugar is mildly elevated at 102 and his LDL cholesterol is elevated.  I would like to see his LDL cholesterol around 100.  Patient would like to work on diet exercise and weight loss prior to starting any medication.  I did recommend discontinuation of oral tobacco products.  Patient is in the pre-contemplative phase for this and has no desire to quit at the present time.  I encouraged the patient receive his flu shot and tetanus shot however he politely declined.  I will give him 30 Norco tablets.  This should last him more than a year.  He anticipates using the medication maybe once or twice every month just for severe pain in his ankle when it keeps him awake at night.

## 2019-01-24 ENCOUNTER — Encounter: Payer: Self-pay | Admitting: Gastroenterology

## 2019-06-06 ENCOUNTER — Other Ambulatory Visit: Payer: Self-pay | Admitting: Family Medicine

## 2019-06-06 ENCOUNTER — Telehealth: Payer: Self-pay | Admitting: *Deleted

## 2019-06-06 MED ORDER — MELOXICAM 15 MG PO TABS: 15.0000 mg | ORAL_TABLET | Freq: Every day | ORAL | 0 refills | Status: DC

## 2019-06-06 NOTE — Telephone Encounter (Signed)
Received call from patient.   Reports L ankle pain. Requested refill on Mobic.   PCP made aware and agreeable.   Prescription sent to pharmacy.

## 2019-06-28 ENCOUNTER — Other Ambulatory Visit: Payer: Self-pay | Admitting: Family Medicine

## 2019-09-23 DIAGNOSIS — J0100 Acute maxillary sinusitis, unspecified: Secondary | ICD-10-CM | POA: Diagnosis not present

## 2019-09-23 DIAGNOSIS — R05 Cough: Secondary | ICD-10-CM | POA: Diagnosis not present

## 2019-09-23 DIAGNOSIS — Z6837 Body mass index (BMI) 37.0-37.9, adult: Secondary | ICD-10-CM | POA: Diagnosis not present

## 2019-09-23 DIAGNOSIS — R5383 Other fatigue: Secondary | ICD-10-CM | POA: Diagnosis not present

## 2019-09-27 ENCOUNTER — Ambulatory Visit: Payer: Federal, State, Local not specified - PPO | Admitting: Family Medicine

## 2019-09-27 ENCOUNTER — Other Ambulatory Visit: Payer: Self-pay

## 2019-09-27 VITALS — BP 110/60 | HR 106 | Temp 98.1°F | Ht 72.0 in | Wt 264.0 lb

## 2019-09-27 DIAGNOSIS — R509 Fever, unspecified: Secondary | ICD-10-CM | POA: Diagnosis not present

## 2019-09-27 DIAGNOSIS — J189 Pneumonia, unspecified organism: Secondary | ICD-10-CM

## 2019-09-27 MED ORDER — DOXYCYCLINE HYCLATE 100 MG PO TABS: 100.0000 mg | ORAL_TABLET | Freq: Two times a day (BID) | ORAL | 0 refills | Status: DC

## 2019-09-27 NOTE — Progress Notes (Signed)
Subjective:    Patient ID: Gregory Lyons, male    DOB: [birth year], 44 y.o.   MRN: [id number]  HPI Symptoms began last Friday.  Symptoms included fatigue and an occasional cough.  Over the weekend he developed severe body aches particularly in his hips, knees, and feet.  He feels achy all over.  Starting Monday he developed fever.  Fevers been as high as 102.7.  He continues to have a cough which is nonproductive, diffuse myalgias and arthralgias, and fever up to 102.7.  Tuesday he went to a local urgent care where a Covid test returned negative.  He denies any tick bites.  However he continues to have the cough.  Today on physical exam, the patient has pronounced right basilar crackles.  Pulse oximetry is 93% on room air Past Medical History:  Diagnosis Date  . Complication of anesthesia    pt states gets mean   . GERD without esophagitis   . Sleep apnea    pt states does not use CPAP pt scored 5 on stop bang tool results sent to PCP    Past Surgical History:  Procedure Laterality Date  . ESOPHAGEAL DILATION N/A 05/19/2016   Procedure: ESOPHAGEAL DILATION;  Surgeon: West Bali, MD;  Location: [facility];  Service: Endoscopy;  Laterality: N/A;  . ESOPHAGOGASTRODUODENOSCOPY N/A 05/19/2016   Procedure: ESOPHAGOGASTRODUODENOSCOPY (EGD);  Surgeon: West Bali, MD;  Location: [facility];  Service: Endoscopy;  Laterality: N/A;  . KNEE ARTHROSCOPY Right 03/12/2014   Procedure: ARTHROSCOPY RIGHT KNEE WITH MENISCAL DEBRIDEMENT condroplasty;  Surgeon: Loanne Drilling, MD;  Location: [facility];  Service: Orthopedics;  Laterality: Right;  . right knee arthroscopy     12 to 13 years ago   . TENDON REPAIR     left hand 2007    Current Outpatient Medications on File Prior to Visit  Medication Sig Dispense Refill  . HYDROcodone-acetaminophen (NORCO) 5-325 MG tablet Take 1 tablet by mouth every 6 (six) hours as needed for moderate pain. 30 tablet 0  . meloxicam (MOBIC) 15 MG tablet TAKE 1  TABLET(15 MG) BY MOUTH DAILY 90 tablet 2  . omeprazole (PRILOSEC) 40 MG capsule TAKE 1 CAPSULE(40 MG) BY MOUTH TWICE DAILY 180 capsule 1   No current facility-administered medications on file prior to visit.   No Known Allergies Social History   Socioeconomic History  . Marital status: Married    Spouse name: Not on file  . Number of children: Not on file  . Years of education: Not on file  . Highest education level: Not on file  Occupational History  . Not on file  Tobacco Use  . Smoking status: Former Smoker    Packs/day: 1.00    Years: 30.00    Pack years: 30.00    Types: Cigarettes    Quit date: 02/15/2008    Years since quitting: 11.6  . Smokeless tobacco: Former Neurosurgeon    Types: Chew  Substance and Sexual Activity  . Alcohol use: Yes    Comment: occas beer   . Drug use: No  . Sexual activity: Not on file  Other Topics Concern  . Not on file  Social History Narrative  . Not on file   Social Determinants of Health   Financial Resource Strain:   . Difficulty of Paying Living Expenses:   Food Insecurity:   . Worried About Programme researcher, broadcasting/film/video in the Last Year:   . [employer] of Food in the  Last Year:   Transportation Needs:   . Freight forwarder (Medical):   Marland Kitchen Lack of Transportation (Non-Medical):   Physical Activity:   . Days of Exercise per Week:   . Minutes of Exercise per Session:   Stress:   . Feeling of Stress :   Social Connections:   . Frequency of Communication with Friends and Family:   . Frequency of Social Gatherings with Friends and Family:   . Attends Religious Services:   . Active Member of Clubs or Organizations:   . Attends Banker Meetings:   Marland Kitchen Marital Status:   Intimate Partner Violence:   . Fear of Current or Ex-Partner:   . Emotionally Abused:   Marland Kitchen Physically Abused:   . Sexually Abused:    No family history on file.' Dad has atrial fibrillation.  There is also remote family history of diabetes   Review of Systems    All other systems reviewed and are negative.      Objective:   Physical Exam Vitals reviewed.  Constitutional:      General: He is not in acute distress.    Appearance: He is obese. He is not ill-appearing, toxic-appearing or diaphoretic.  HENT:     Head: Normocephalic and atraumatic.     Right Ear: Tympanic membrane and ear canal normal. There is no impacted cerumen.     Left Ear: Tympanic membrane and ear canal normal. There is no impacted cerumen.     Nose: Congestion present. No rhinorrhea.     Mouth/Throat:     Mouth: Mucous membranes are moist.     Pharynx: Oropharynx is clear. No oropharyngeal exudate or posterior oropharyngeal erythema.  Eyes:     General: No scleral icterus.       Right eye: No discharge.        Left eye: No discharge.     Extraocular Movements: Extraocular movements intact.     Conjunctiva/sclera: Conjunctivae normal.     Pupils: Pupils are equal, round, and reactive to light.  Neck:     Vascular: No carotid bruit.  Cardiovascular:     Rate and Rhythm: Regular rhythm. Tachycardia present.     Pulses: Normal pulses.     Heart sounds: Normal heart sounds. No murmur heard.  No friction rub. No gallop.   Pulmonary:     Effort: Pulmonary effort is normal. No respiratory distress.     Breath sounds: No stridor. Examination of the right-lower field reveals rales. Rales present. No wheezing or rhonchi.  Chest:     Chest wall: No tenderness.  Abdominal:     General: Abdomen is flat. Bowel sounds are normal. There is no distension.     Palpations: Abdomen is soft. There is no mass.     Tenderness: There is no abdominal tenderness. There is no right CVA tenderness, left CVA tenderness, guarding or rebound.     Hernia: No hernia is present.  Musculoskeletal:        General: Tenderness present.     Cervical back: Normal range of motion and neck supple. No muscular tenderness.     Right lower leg: No edema.     Left lower leg: No edema.  Lymphadenopathy:      Cervical: No cervical adenopathy.  Skin:    General: Skin is warm.     Coloration: Skin is not jaundiced.     Findings: No bruising, erythema, lesion or rash.  Neurological:     General: No focal deficit  present.     Mental Status: He is alert and oriented to person, place, and time. Mental status is at baseline.     Cranial Nerves: No cranial nerve deficit.     Sensory: No sensory deficit.     Motor: No weakness.     Coordination: Coordination normal.     Gait: Gait normal.  Psychiatric:        Mood and Affect: Mood normal.        Behavior: Behavior normal.        Thought Content: Thought content normal.        Judgment: Judgment normal.           Assessment & Plan:  Fever, unspecified fever cause - Plan: SARS-COV-2 RNA,(COVID-19) QUAL NAAT  Pneumonia of right lower lobe due to infectious organism  Patient appears to have right lower lobe pneumonia.  Given the current situation I am highly suspicious that he may have COVID-19.  Unfortunately, I do not have a positive test upon which I can refer the patient for Regeneron therapy.  I did repeat his Covid test today.  He is not yet hypoxic and therefore does not require hospital admission for dexamethasone or remdesivir.  Therefore I recommended symptomatic therapy with Tylenol and/or ibuprofen for fever and pushing fluids.  I did caution the patient that if he develops any shortness of breath he needs to go to the emergency room immediately.  Meanwhile I will cover the patient for possible bacterial community-acquired pneumonia with doxycycline 100 mg p.o. twice daily for 10 days.  I did explain to the patient that antibiotics would not help if this were Covid however in the event that this is community-acquired bacterial pneumonia I believe it would be beneficial.  Recheck next week or go to the emergency room immediately over the weekend if he develops any shortness of breath.  I did recommend that he quarantine

## 2019-09-30 LAB — SARS-COV-2 RNA,(COVID-19) QUALITATIVE NAAT

## 2019-10-01 ENCOUNTER — Other Ambulatory Visit: Payer: Self-pay

## 2019-10-01 ENCOUNTER — Inpatient Hospital Stay (HOSPITAL_COMMUNITY)
Admission: EM | Admit: 2019-10-01 | Discharge: 2019-10-02 | DRG: 177 | Disposition: A | Discharge status: Home / Self Care | Payer: Federal, State, Local not specified - PPO | Attending: Family Medicine | Admitting: Family Medicine

## 2019-10-01 ENCOUNTER — Emergency Department (HOSPITAL_COMMUNITY): Payer: Federal, State, Local not specified - PPO

## 2019-10-01 ENCOUNTER — Encounter (HOSPITAL_COMMUNITY): Payer: Self-pay

## 2019-10-01 DIAGNOSIS — Z791 Long term (current) use of non-steroidal anti-inflammatories (NSAID): Secondary | ICD-10-CM

## 2019-10-01 DIAGNOSIS — U071 COVID-19: Principal | ICD-10-CM

## 2019-10-01 DIAGNOSIS — Z79899 Other long term (current) drug therapy: Secondary | ICD-10-CM | POA: Diagnosis not present

## 2019-10-01 DIAGNOSIS — A419 Sepsis, unspecified organism: Secondary | ICD-10-CM | POA: Diagnosis not present

## 2019-10-01 DIAGNOSIS — E872 Acidosis: Secondary | ICD-10-CM | POA: Diagnosis not present

## 2019-10-01 DIAGNOSIS — Z87891 Personal history of nicotine dependence: Secondary | ICD-10-CM | POA: Diagnosis not present

## 2019-10-01 DIAGNOSIS — R0602 Shortness of breath: Secondary | ICD-10-CM | POA: Diagnosis not present

## 2019-10-01 DIAGNOSIS — R509 Fever, unspecified: Secondary | ICD-10-CM | POA: Diagnosis not present

## 2019-10-01 DIAGNOSIS — G4733 Obstructive sleep apnea (adult) (pediatric): Secondary | ICD-10-CM | POA: Diagnosis present

## 2019-10-01 DIAGNOSIS — R0902 Hypoxemia: Secondary | ICD-10-CM

## 2019-10-01 DIAGNOSIS — R110 Nausea: Secondary | ICD-10-CM

## 2019-10-01 DIAGNOSIS — K219 Gastro-esophageal reflux disease without esophagitis: Secondary | ICD-10-CM | POA: Diagnosis present

## 2019-10-01 DIAGNOSIS — I517 Cardiomegaly: Secondary | ICD-10-CM | POA: Diagnosis not present

## 2019-10-01 DIAGNOSIS — J9601 Acute respiratory failure with hypoxia: Secondary | ICD-10-CM | POA: Diagnosis not present

## 2019-10-01 DIAGNOSIS — J1282 Pneumonia due to coronavirus disease 2019: Secondary | ICD-10-CM | POA: Diagnosis present

## 2019-10-01 LAB — CBC WITH DIFFERENTIAL/PLATELET
Abs Immature Granulocytes: 0.03 10*3/uL (ref 0.00–0.07)
Basophils Absolute: 0 10*3/uL (ref 0.0–0.1)
Basophils Relative: 0 %
Eosinophils Absolute: 0 10*3/uL (ref 0.0–0.5)
Eosinophils Relative: 0 %
HCT: 45.4 % (ref 39.0–52.0)
Hemoglobin: 15.4 g/dL (ref 13.0–17.0)
Immature Granulocytes: 0 %
Lymphocytes Relative: 19 %
Lymphs Abs: 1.5 10*3/uL (ref 0.7–4.0)
MCH: 30.3 pg (ref 26.0–34.0)
MCHC: 33.9 g/dL (ref 30.0–36.0)
MCV: 89.4 fL (ref 80.0–100.0)
Monocytes Absolute: 0.7 10*3/uL (ref 0.1–1.0)
Monocytes Relative: 9 %
Neutro Abs: 5.8 10*3/uL (ref 1.7–7.7)
Neutrophils Relative %: 72 %
Platelets: 246 10*3/uL (ref 150–400)
RBC: 5.08 MIL/uL (ref 4.22–5.81)
RDW: 12.8 % (ref 11.5–15.5)
WBC: 8 10*3/uL (ref 4.0–10.5)
nRBC: 0 % (ref 0.0–0.2)

## 2019-10-01 LAB — COMPREHENSIVE METABOLIC PANEL
ALT: 55 U/L — ABNORMAL HIGH (ref 0–44)
AST: 51 U/L — ABNORMAL HIGH (ref 15–41)
Albumin: 3.9 g/dL (ref 3.5–5.0)
Alkaline Phosphatase: 42 U/L (ref 38–126)
Anion gap: 15 (ref 5–15)
BUN: 15 mg/dL (ref 6–20)
CO2: 22 mmol/L (ref 22–32)
Calcium: 8.9 mg/dL (ref 8.9–10.3)
Chloride: 100 mmol/L (ref 98–111)
Creatinine, Ser: 0.81 mg/dL (ref 0.61–1.24)
GFR calc Af Amer: >60 mL/min (ref >60)
GFR calc non Af Amer: >60 mL/min (ref >60)
Glucose, Bld: 124 mg/dL — ABNORMAL HIGH (ref 70–99)
Potassium: 3.7 mmol/L (ref 3.5–5.1)
Sodium: 137 mmol/L (ref 135–145)
Total Bilirubin: 1.3 mg/dL — ABNORMAL HIGH (ref 0.3–1.2)
Total Protein: 7.9 g/dL (ref 6.5–8.1)

## 2019-10-01 LAB — CBG MONITORING, ED
Glucose-Capillary: 96 mg/dL (ref 70–99)
Glucose-Capillary: 144 mg/dL — ABNORMAL HIGH (ref 70–99)

## 2019-10-01 LAB — BRAIN NATRIURETIC PEPTIDE: B Natriuretic Peptide: 36 pg/mL (ref 0.0–100.0)

## 2019-10-01 LAB — URINALYSIS, ROUTINE W REFLEX MICROSCOPIC
Bacteria, UA: NONE SEEN
Bilirubin Urine: NEGATIVE
Glucose, UA: NEGATIVE mg/dL
Hgb urine dipstick: NEGATIVE
Ketones, ur: 5 mg/dL — AB
Leukocytes,Ua: NEGATIVE
Nitrite: NEGATIVE
Protein, ur: 30 mg/dL — AB
Specific Gravity, Urine: 1.021 (ref 1.005–1.030)
pH: 6 (ref 5.0–8.0)

## 2019-10-01 LAB — PROCALCITONIN: Procalcitonin: <0.1 ng/mL

## 2019-10-01 LAB — PROTIME-INR
INR: 0.9 (ref 0.8–1.2)
Prothrombin Time: 12.1 seconds (ref 11.4–15.2)

## 2019-10-01 LAB — D-DIMER, QUANTITATIVE: D-Dimer, Quant: 1.39 ug/mL-FEU — ABNORMAL HIGH (ref 0.00–0.50)

## 2019-10-01 LAB — HIV ANTIBODY (ROUTINE TESTING W REFLEX): HIV Screen 4th Generation wRfx: NONREACTIVE

## 2019-10-01 LAB — GLUCOSE, CAPILLARY: Glucose-Capillary: 181 mg/dL — ABNORMAL HIGH (ref 70–99)

## 2019-10-01 LAB — SARS CORONAVIRUS 2 BY RT PCR (HOSPITAL ORDER, PERFORMED IN ~~LOC~~ HOSPITAL LAB): SARS Coronavirus 2: POSITIVE — AB

## 2019-10-01 LAB — FIBRINOGEN: Fibrinogen: 649 mg/dL — ABNORMAL HIGH (ref 210–475)

## 2019-10-01 LAB — LACTATE DEHYDROGENASE: LDH: 325 U/L — ABNORMAL HIGH (ref 98–192)

## 2019-10-01 LAB — C-REACTIVE PROTEIN: CRP: 10.7 mg/dL — ABNORMAL HIGH (ref <1.0)

## 2019-10-01 LAB — LACTIC ACID, PLASMA
Lactic Acid, Venous: 2 mmol/L (ref 0.5–1.9)
Lactic Acid, Venous: 1.1 mmol/L (ref 0.5–1.9)

## 2019-10-01 LAB — APTT: aPTT: 25 seconds (ref 24–36)

## 2019-10-01 LAB — ABO/RH: ABO/RH(D): B POS

## 2019-10-01 LAB — FERRITIN: Ferritin: 642 ng/mL — ABNORMAL HIGH (ref 24–336)

## 2019-10-01 MED ORDER — PANTOPRAZOLE SODIUM 40 MG PO TBEC
40.0000 mg | DELAYED_RELEASE_TABLET | Freq: Every day | ORAL | Status: DC
  Administered 2019-10-01 – 2019-10-02 (×2): 40 mg via ORAL
  Filled 2019-10-01 (×2): qty 1

## 2019-10-01 MED ORDER — METHYLPREDNISOLONE SODIUM SUCC 125 MG IJ SOLR
0.5000 mg/kg | Freq: Two times a day (BID) | INTRAMUSCULAR | Status: DC
  Administered 2019-10-01 – 2019-10-02 (×3): 58.125 mg via INTRAVENOUS
  Filled 2019-10-01 (×3): qty 2

## 2019-10-01 MED ORDER — ONDANSETRON HCL 4 MG/2ML IJ SOLN: 4.0000 mg | Freq: Four times a day (QID) | INTRAMUSCULAR | Status: DC | PRN

## 2019-10-01 MED ORDER — ACETAMINOPHEN 325 MG PO TABS: 650.0000 mg | ORAL_TABLET | Freq: Four times a day (QID) | ORAL | Status: DC | PRN

## 2019-10-01 MED ORDER — SODIUM CHLORIDE 0.9 % IV BOLUS
1000.0000 mL | Freq: Once | INTRAVENOUS | Status: AC
  Administered 2019-10-01: 1000 mL via INTRAVENOUS

## 2019-10-01 MED ORDER — GUAIFENESIN-DM 100-10 MG/5ML PO SYRP: 10.0000 mL | ORAL_SOLUTION | ORAL | Status: DC | PRN

## 2019-10-01 MED ORDER — SODIUM CHLORIDE 0.9 % IV SOLN
100.0000 mg | Freq: Every day | INTRAVENOUS | Status: DC
  Administered 2019-10-01 – 2019-10-02 (×2): 100 mg via INTRAVENOUS
  Filled 2019-10-01 (×3): qty 20

## 2019-10-01 MED ORDER — INSULIN ASPART 100 UNIT/ML ~~LOC~~ SOLN
0.0000 [IU] | Freq: Three times a day (TID) | SUBCUTANEOUS | Status: DC
  Administered 2019-10-02: 1 [IU] via SUBCUTANEOUS

## 2019-10-01 MED ORDER — LINAGLIPTIN 5 MG PO TABS
5.0000 mg | ORAL_TABLET | Freq: Every day | ORAL | Status: DC
  Administered 2019-10-01 – 2019-10-02 (×2): 5 mg via ORAL
  Filled 2019-10-01 (×4): qty 1

## 2019-10-01 MED ORDER — SENNOSIDES-DOCUSATE SODIUM 8.6-50 MG PO TABS
2.0000 | ORAL_TABLET | Freq: Every evening | ORAL | Status: DC | PRN
  Filled 2019-10-01: qty 2

## 2019-10-01 MED ORDER — HYDROCOD POLST-CPM POLST ER 10-8 MG/5ML PO SUER: 5.0000 mL | Freq: Two times a day (BID) | ORAL | Status: DC | PRN

## 2019-10-01 MED ORDER — ONDANSETRON HCL 4 MG PO TABS: 4.0000 mg | ORAL_TABLET | Freq: Four times a day (QID) | ORAL | Status: DC | PRN

## 2019-10-01 MED ORDER — FAMOTIDINE 20 MG PO TABS
20.0000 mg | ORAL_TABLET | Freq: Two times a day (BID) | ORAL | Status: DC
  Administered 2019-10-01 – 2019-10-02 (×2): 20 mg via ORAL
  Filled 2019-10-01 (×2): qty 1

## 2019-10-01 MED ORDER — SODIUM CHLORIDE 0.9 % IV SOLN
500.0000 mg | Freq: Once | INTRAVENOUS | Status: AC
  Administered 2019-10-01: 500 mg via INTRAVENOUS
  Filled 2019-10-01: qty 500

## 2019-10-01 MED ORDER — SODIUM CHLORIDE 0.9 % IV SOLN: 200.0000 mg | Freq: Once | INTRAVENOUS | Status: DC

## 2019-10-01 MED ORDER — ENOXAPARIN SODIUM 40 MG/0.4ML ~~LOC~~ SOLN
40.0000 mg | SUBCUTANEOUS | Status: DC
  Administered 2019-10-01: 40 mg via SUBCUTANEOUS
  Filled 2019-10-01 (×2): qty 0.4

## 2019-10-01 MED ORDER — INSULIN ASPART 100 UNIT/ML ~~LOC~~ SOLN: 0.0000 [IU] | Freq: Every day | SUBCUTANEOUS | Status: DC

## 2019-10-01 MED ORDER — IPRATROPIUM-ALBUTEROL 20-100 MCG/ACT IN AERS
1.0000 | INHALATION_SPRAY | Freq: Four times a day (QID) | RESPIRATORY_TRACT | Status: DC
  Administered 2019-10-01 – 2019-10-02 (×3): 1 via RESPIRATORY_TRACT
  Filled 2019-10-01: qty 4

## 2019-10-01 MED ORDER — POLYETHYLENE GLYCOL 3350 17 G PO PACK: 17.0000 g | PACK | Freq: Every day | ORAL | Status: DC | PRN

## 2019-10-01 MED ORDER — ONDANSETRON HCL 4 MG/2ML IJ SOLN
4.0000 mg | Freq: Once | INTRAMUSCULAR | Status: AC
  Administered 2019-10-01: 4 mg via INTRAVENOUS
  Filled 2019-10-01: qty 2

## 2019-10-01 MED ORDER — SODIUM CHLORIDE 0.9 % IV SOLN
1.0000 g | Freq: Once | INTRAVENOUS | Status: AC
  Administered 2019-10-01: 1 g via INTRAVENOUS
  Filled 2019-10-01: qty 10

## 2019-10-01 MED ORDER — OXYCODONE HCL 5 MG PO TABS: 5.0000 mg | ORAL_TABLET | ORAL | Status: DC | PRN

## 2019-10-01 MED ORDER — SODIUM CHLORIDE 0.9 % IV SOLN: 100.0000 mg | Freq: Every day | INTRAVENOUS | Status: DC

## 2019-10-01 MED ORDER — SODIUM CHLORIDE 0.9 % IV SOLN
100.0000 mg | INTRAVENOUS | Status: AC
  Administered 2019-10-01: 100 mg via INTRAVENOUS

## 2019-10-01 NOTE — H&P (Signed)
History and Physical    [name]:[id number] DOB: Oct 26, 1975 DOA: 10/01/2019  PCP: Donita Brooks, MD Patient coming from: Home  Chief Complaint: Shortness of breath  HPI: Gregory Lyons is a 44 y.o. male with medical history significant of GERD, obstructive sleep apnea on CPAP comes to the hospital with complains of shortness of breath.  Patient states he has had intermittent fevers, chills, body ache and progressive shortness of breath for the past week.  He went to go see his PCP about a week ago and his COVID-19 test was negative.  His symptoms continued therefore went again to go see PCP when he was prescribed doxycycline which she was only taken 1 dose of.  Due to persistence of his symptoms, poor oral intake he came to the hospital for further evaluation  Patient has not been vaccinated for COVID-19  In the ER his routine blood work was unremarkable besides elevated inflammatory markers mild lactic acidosis.  Chest x-ray showed left lower lobe infiltrate.  He was desaturating down to 85% on room air.  Medical team requested to admit the patient.  Received 1 dose of IV azithromycin and Rocephin in the ER.  Social history-denies any alcohol, tobacco and illicit drug use   Review of Systems: As per HPI otherwise 10 point review of systems negative.  Review of Systems Otherwise negative except as per HPI, including: General: Denies , night sweats or unintended weight loss. Resp: Denies cough, wheezing Cardiac: Denies chest pain, palpitations, orthopnea, paroxysmal nocturnal dyspnea. GI: Denies abdominal pain, nausea, vomiting, diarrhea or constipation GU: Denies dysuria, frequency, hesitancy or incontinence MS: Denies muscle aches, joint pain or swelling Neuro: Denies headache, neurologic deficits (focal weakness, numbness, tingling), abnormal gait Psych: Denies anxiety, depression, SI/HI/AVH Skin: Denies new rashes or lesions ID: Denies sick contacts, exotic  exposures, travel  Past Medical History:  Diagnosis Date  . Complication of anesthesia    pt states gets mean   . GERD without esophagitis   . Sleep apnea    pt states does not use CPAP pt scored 5 on stop bang tool results sent to PCP     Past Surgical History:  Procedure Laterality Date  . ESOPHAGEAL DILATION N/A 05/19/2016   Procedure: ESOPHAGEAL DILATION;  Surgeon: West Bali, MD;  [facility];  Service: Endoscopy;  Laterality: N/A;  . ESOPHAGOGASTRODUODENOSCOPY N/A 05/19/2016   Procedure: ESOPHAGOGASTRODUODENOSCOPY (EGD);  Surgeon: West Bali, MD;  Location: [facility];  Service: Endoscopy;  Laterality: N/A;  . KNEE ARTHROSCOPY Right 03/12/2014   Procedure: ARTHROSCOPY RIGHT KNEE WITH MENISCAL DEBRIDEMENT condroplasty;  Surgeon: Loanne Drilling, MD;  Location: [facility];  Service: Orthopedics;  Laterality: Right;  . right knee arthroscopy     12 to 13 years ago   . TENDON REPAIR     left hand 2007     SOCIAL HISTORY:  reports that he quit smoking about 11 years ago. His smoking use included cigarettes. He has a 30.00 pack-year smoking history. He has quit using smokeless tobacco.  His smokeless tobacco use included chew. He reports current alcohol use. He reports that he does not use drugs.  No Known Allergies  FAMILY HISTORY: No family history on file.   Prior to Admission medications   Medication Sig Start Date End Date Taking? Authorizing Provider  doxycycline (VIBRA-TABS) 100 MG tablet Take 1 tablet (100 mg total) by mouth 2 (two) times daily. 09/27/19   Donita Brooks, MD  HYDROcodone-acetaminophen (  NORCO) 5-325 MG tablet Take 1 tablet by mouth every 6 (six) hours as needed for moderate pain. 11/26/18   Donita Brooks, MD  meloxicam (MOBIC) 15 MG tablet TAKE 1 TABLET(15 MG) BY MOUTH DAILY 07/01/19   Donita Brooks, MD  omeprazole (PRILOSEC) 40 MG capsule TAKE 1 CAPSULE(40 MG) BY MOUTH TWICE DAILY 06/07/19   Donita Brooks, MD    Physical  Exam: Vitals:   10/01/19 1136 10/01/19 1204 10/01/19 1238 10/01/19 1351  BP: 117/82 120/78 116/79 120/82  Pulse: 84 84 87 81  Resp: 18 18 18 18   Temp:      TempSrc:      SpO2: 96% 96% 98% 96%  Weight:      Height:          Constitutional: NAD, calm, comfortable Eyes: PERRL, lids and conjunctivae normal ENMT: Mucous membranes are moist. Posterior pharynx clear of any exudate or lesions.Normal dentition.  Neck: normal, supple, no masses, no thyromegaly Respiratory: Left basilar rhonchi Cardiovascular: Regular rate and rhythm, no murmurs / rubs / gallops. No extremity edema. 2+ pedal pulses. No carotid bruits.  Abdomen: no tenderness, no masses palpated. No hepatosplenomegaly. Bowel sounds positive.  Musculoskeletal: no clubbing / cyanosis. No joint deformity upper and lower extremities. Good ROM, no contractures. Normal muscle tone.  Skin: no rashes, lesions, ulcers. No induration Neurologic: CN 2-12 grossly intact. Sensation intact, DTR normal. Strength 5/5 in all 4.  Psychiatric: Normal judgment and insight. Alert and oriented x 3. Normal mood.     Labs on Admission: I have personally reviewed following labs and imaging studies  CBC: Recent Labs  Lab 10/01/19 0948  WBC 8.0  NEUTROABS 5.8  HGB 15.4  HCT 45.4  MCV 89.4  PLT 246   Basic Metabolic Panel: Recent Labs  Lab 10/01/19 0948  NA 137  K 3.7  CL 100  CO2 22  GLUCOSE 124*  BUN 15  CREATININE 0.81  CALCIUM 8.9   GFR: Estimated Creatinine Clearance: 150.2 mL/min (by C-G formula based on SCr of 0.81 mg/dL). Liver Function Tests: Recent Labs  Lab 10/01/19 0948  AST 51*  ALT 55*  ALKPHOS 42  BILITOT 1.3*  PROT 7.9  ALBUMIN 3.9   No results for input(s): LIPASE, AMYLASE in the last 168 hours. No results for input(s): AMMONIA in the last 168 hours. Coagulation Profile: Recent Labs  Lab 10/01/19 0948  INR 0.9   Cardiac Enzymes: No results for input(s): CKTOTAL, CKMB, CKMBINDEX, TROPONINI in the  last 168 hours. BNP (last 3 results) No results for input(s): PROBNP in the last 8760 hours. HbA1C: No results for input(s): HGBA1C in the last 72 hours. CBG: Recent Labs  Lab 10/01/19 1302  GLUCAP 96   Lipid Profile: No results for input(s): CHOL, HDL, LDLCALC, TRIG, CHOLHDL, LDLDIRECT in the last 72 hours. Thyroid Function Tests: No results for input(s): TSH, T4TOTAL, FREET4, T3FREE, THYROIDAB in the last 72 hours. Anemia Panel: No results for input(s): VITAMINB12, FOLATE, FERRITIN, TIBC, IRON, RETICCTPCT in the last 72 hours. Urine analysis: No results found for: COLORURINE, APPEARANCEUR, LABSPEC, PHURINE, GLUCOSEU, HGBUR, BILIRUBINUR, KETONESUR, PROTEINUR, UROBILINOGEN, NITRITE, LEUKOCYTESUR Sepsis Labs: !!!!!!!!!!!!!!!!!!!!!!!!!!!!!!!!!!!!!!!!!!!! @LABRCNTIP (procalcitonin:4,lacticidven:4) ) Recent Results (from the past 240 hour(s))  SARS-COV-2 RNA,(COVID-19) QUAL NAAT     Status: None   Collection Time: 09/27/19 11:24 AM   Specimen: Respiratory  Result Value Ref Range Status   SARS CoV2 RNA CANCELED      Comment: TEST NOT PERFORMED . Quantity not sufficient.  Result canceled  by the ancillary.   SARS Coronavirus 2 by RT PCR (hospital order, performed in [facility] hospital lab) Nasopharyngeal Nasopharyngeal Swab     Status: Abnormal   Collection Time: 10/01/19  9:26 AM   Specimen: Nasopharyngeal Swab  Result Value Ref Range Status   SARS Coronavirus 2 POSITIVE (A) NEGATIVE Final    Comment: RESULT CALLED TO, READ BACK BY AND VERIFIED WITH: CARDWELL L. @ 1107 ON [id number] BY HENDERSON L. (NOTE) SARS-CoV-2 target nucleic acids are DETECTED  SARS-CoV-2 RNA is generally detectable in upper respiratory specimens  during the acute phase of infection.  Positive results are indicative  of the presence of the identified virus, but do not rule out bacterial infection or co-infection with other pathogens not detected by the test.  Clinical correlation with patient history and    other diagnostic information is necessary to determine patient infection status.  The expected result is negative.  Fact Sheet for Patients:   BoilerBrush.com.cy   Fact Sheet for Healthcare Providers:   https://pope.com/    This test is not yet approved or cleared by the [place] FDA and  has been authorized for detection and/or diagnosis of SARS-CoV-2 by FDA under an Emergency Use Authorization (EUA).  This EUA will remain in effect (meani ng this test can be used) for the duration of  the COVID-19 declaration under Section 564(b)(1) of the Act, 21 U.S.C. section 360-bbb-3(b)(1), unless the authorization is terminated or revoked sooner.  Performed at [facility], [address] [place], [place] [address]      Radiological Exams on Admission: DG Chest [facility] 1 View  Result Date: 10/01/2019 CLINICAL DATA:  Fever.  Shortness of breath.  Questionable sepsis. EXAM: PORTABLE CHEST 1 VIEW COMPARISON:  No prior. FINDINGS: Mediastinum and hilar structures normal. Low lung volumes. Mild left base infiltrate. No pleural effusion or pneumothorax. Biapical pleural thickening consistent scarring. Cardiomegaly. No pulmonary venous congestion. No acute bony abnormality. IMPRESSION: Lung volumes.  Mild left base infiltrate suggesting pneumonia. Electronically Signed   By: Maisie Fus  Register   On: 10/01/2019 10:19     All images have been reviewed by me personally.    Assessment/Plan Principal Problem:   Acute respiratory failure with hypoxia (HCC) Active Problems:   GERD (gastroesophageal reflux disease)   COVID-19 virus infection      Acute respiratory distress with hypoxia secondary to COVID-19 infection -Admitted patient for further care and management -Oxygen levels-85% on room air, 96% on 2 L nasal cannula -Remdesivir-day 1 -Solu-Medrol IV every 12 hours -day 1 -Actemra-not ordered -Antibiotics-received a dose in the ER, holding  off on it -procalcitonin-pending -Chest x-ray-left lower lobe infiltrate -Supportive care-antitussive, inhalers, I-S/flutter -CODE STATUS confirmed -Vitamin C & Zinc. Prone >16hrs/day.  -Routine: Labs have been reviewed including ferritin, LDH, CRP, d-dimer, fibrinogen.  Will need to trend this lab daily.  Had extensive discussion with the patient regarding off label use of Actemra if there is  worsening of shortness of breath, oxygen requirement from hypoxia and elevation in inflammatory markers.  Denies any active/latent tuberculosis, hepatitis infection, active immunosuppressive state including chemotherapy.  No severe sepsis.  Patient understood and all the questions answered.  Agreed to proceed with it if necessary  GERD -PPI daily  Obstructive sleep apnea -Bedtime sleep   Body mass index is 34.86 kg/m.        DVT prophylaxis: Lovenox Code Status: Full code Family Communication: None Consults called: None Admission status: MedSurg Status is: Inpatient  Remains inpatient appropriate because:Inpatient  level of care appropriate due to severity of illness   Dispo: The patient is from: Home              Anticipated d/c is to: Home              Anticipated d/c date is: 3 days              Patient currently is not medically stable to d/c.  Patient admitted for hypoxia secondary to COVID-19 pneumonia started on IV remdesivir.  Maintain hospital stay       Time Spent: 65 minutes.  >50% of the time was devoted to discussing the patients care, assessment, plan and disposition with other care givers along with counseling the patient about the risks and benefits of treatment.    Nadyne Gariepy Joline Maxcyhirag Alexiya Franqui MD [facility]  If 7PM-7AM, please contact night-coverage   10/01/2019, 2:40 PM

## 2019-10-01 NOTE — ED Provider Notes (Signed)
[facility] EMERGENCY DEPARTMENT Provider Note   CSN: [id number] Arrival date & time: 10/01/19  4098     History Chief Complaint  Patient presents with  . Shortness of Breath  . Nausea    Gregory Lyons is a 44 y.o. male with a history of GERD, sleep apnea using CPAP, no other significant past medical history presenting with an approximate 8 to 10-day history of cough which has been productive of a yellow to green sputum, shortness of breath and oxygen saturations into the mid 80s intermittently.  He has been checking his oxygen level at home, and when it drops into the 80s, he has been getting on a CPAP machine to improve his oxygenation.  He has been seen by his PCP, and has also been tested for Covid first last Monday which was negative.  He had a repeat Covid test 4 days ago, somehow this test was lost and never resulted.  He was placed on doxycycline by his PCP to cover him for possible bacterial pneumonia, but he has been very nauseated and has been unable to tolerate this antibiotic, only took 1 tablet.  He denies vomiting, abdominal pain, diarrhea, dysuria or reduced urinary output.  He does endorse fevers up to 102.5, along with generalized myalgias and increasing weakness and fatigue.  Pt has not been vaccinated for Covid 19.  The history is provided by the patient.       Past Medical History:  Diagnosis Date  . Complication of anesthesia    pt states gets mean   . GERD without esophagitis   . Sleep apnea    pt states does not use CPAP pt scored 5 on stop bang tool results sent to PCP     Patient Active Problem List   Diagnosis Date Noted  . Dysphagia, idiopathic 02/28/2018  . GERD (gastroesophageal reflux disease) 02/28/2018  . Impacted esophageal foreign body, initial encounter   . Acute medial meniscal tear 03/11/2014    Past Surgical History:  Procedure Laterality Date  . ESOPHAGEAL DILATION N/A 05/19/2016   Procedure: ESOPHAGEAL DILATION;  Surgeon: West Bali, MD;  [facility];  Service: Endoscopy;  Laterality: N/A;  . ESOPHAGOGASTRODUODENOSCOPY N/A 05/19/2016   Procedure: ESOPHAGOGASTRODUODENOSCOPY (EGD);  Surgeon: West Bali, MD;  [facility];  Service: Endoscopy;  Laterality: N/A;  . KNEE ARTHROSCOPY Right 03/12/2014   Procedure: ARTHROSCOPY RIGHT KNEE WITH MENISCAL DEBRIDEMENT condroplasty;  Surgeon: Loanne Drilling, MD;  Location: [facility];  Service: Orthopedics;  Laterality: Right;  . right knee arthroscopy     12 to 13 years ago   . TENDON REPAIR     left hand 2007        No family history on file.  Social History   Tobacco Use  . Smoking status: Former Smoker    Packs/day: 1.00    Years: 30.00    Pack years: 30.00    Types: Cigarettes    Quit date: 02/15/2008    Years since quitting: 11.6  . Smokeless tobacco: Former Neurosurgeon    Types: Chew  Substance Use Topics  . Alcohol use: Yes    Comment: occas beer   . Drug use: No    Home Medications Prior to Admission medications   Medication Sig Start Date End Date Taking? Authorizing Provider  doxycycline (VIBRA-TABS) 100 MG tablet Take 1 tablet (100 mg total) by mouth 2 (two) times daily. 09/27/19   Donita Brooks, MD  HYDROcodone-acetaminophen [facility])  5-325 MG tablet Take 1 tablet by mouth every 6 (six) hours as needed for moderate pain. 11/26/18   Donita Brooks, MD  meloxicam (MOBIC) 15 MG tablet TAKE 1 TABLET(15 MG) BY MOUTH DAILY 07/01/19   Donita Brooks, MD  omeprazole (PRILOSEC) 40 MG capsule TAKE 1 CAPSULE(40 MG) BY MOUTH TWICE DAILY 06/07/19   Donita Brooks, MD    Allergies    Patient has no known allergies.  Review of Systems   Review of Systems  Constitutional: Positive for chills and fever.  HENT: Positive for congestion. Negative for sore throat.   Eyes: Negative.   Respiratory: Positive for cough and shortness of breath. Negative for chest tightness and wheezing.   Cardiovascular: Negative for chest pain.    Gastrointestinal: Positive for nausea. Negative for abdominal pain, diarrhea and vomiting.  Genitourinary: Negative.  Negative for dysuria.  Musculoskeletal: Positive for myalgias. Negative for arthralgias, joint swelling and neck pain.  Skin: Negative.  Negative for rash and wound.  Neurological: Negative for dizziness, weakness, light-headedness, numbness and headaches.  Psychiatric/Behavioral: Negative.     Physical Exam Updated Vital Signs BP 114/67 (BP Location: Right Arm)   Pulse 90   Temp 98 F (36.7 C) (Oral)   Resp 18   Ht 6' (1.829 m)   Wt 116.6 kg   SpO2 99%   BMI 34.86 kg/m   Physical Exam Vitals and nursing note reviewed.  Constitutional:      Appearance: He is well-developed. He is not toxic-appearing.     Comments: Appears fatigued.  HENT:     Head: Normocephalic and atraumatic.  Eyes:     Conjunctiva/sclera: Conjunctivae normal.  Cardiovascular:     Rate and Rhythm: Normal rate and regular rhythm.     Heart sounds: Normal heart sounds.  Pulmonary:     Effort: Pulmonary effort is normal.     Breath sounds: Examination of the left-lower field reveals decreased breath sounds and rhonchi. Decreased breath sounds and rhonchi present. No wheezing or rales.  Abdominal:     General: Bowel sounds are normal.     Palpations: Abdomen is soft.     Tenderness: There is no abdominal tenderness.  Musculoskeletal:        General: Normal range of motion.     Cervical back: Normal range of motion.  Skin:    General: Skin is warm and dry.  Neurological:     General: No focal deficit present.     Mental Status: He is alert.  Psychiatric:        Mood and Affect: Mood normal.     ED Results / Procedures / Treatments   Labs (all labs ordered are listed, but only abnormal results are displayed) Labs Reviewed  SARS CORONAVIRUS 2 BY RT PCR (HOSPITAL ORDER, PERFORMED IN [facility]) - Abnormal; Notable for the following components:      Result Value    SARS Coronavirus 2 POSITIVE (*)    All other components within normal limits  LACTIC ACID, PLASMA - Abnormal; Notable for the following components:   Lactic Acid, Venous 2.0 (*)    All other components within normal limits  COMPREHENSIVE METABOLIC PANEL - Abnormal; Notable for the following components:   Glucose, Bld 124 (*)    AST 51 (*)    ALT 55 (*)    Total Bilirubin 1.3 (*)    All other components within normal limits  CULTURE, BLOOD (ROUTINE X 2)  CULTURE, BLOOD (ROUTINE X  2)  CBC WITH DIFFERENTIAL/PLATELET  PROTIME-INR  APTT  LACTIC ACID, PLASMA  URINALYSIS, ROUTINE W REFLEX MICROSCOPIC    EKG EKG Interpretation  Date/Time:  Tuesday October 01 2019 09:20:15 EDT Ventricular Rate:  84 PR Interval:    QRS Duration: 101 QT Interval:  388 QTC Calculation: 459 R Axis:   75 Text Interpretation: Sinus rhythm Nonspecific ST abnormality Confirmed by Bethann Berkshire [phone]) on 10/01/2019 11:47:31 AM   Radiology DG Chest Port 1 View  Result Date: 10/01/2019 CLINICAL DATA:  Fever.  Shortness of breath.  Questionable sepsis. EXAM: PORTABLE CHEST 1 VIEW COMPARISON:  No prior. FINDINGS: Mediastinum and hilar structures normal. Low lung volumes. Mild left base infiltrate. No pleural effusion or pneumothorax. Biapical pleural thickening consistent scarring. Cardiomegaly. No pulmonary venous congestion. No acute bony abnormality. IMPRESSION: Lung volumes.  Mild left base infiltrate suggesting pneumonia. Electronically Signed   By: Maisie Fus  Register   On: 10/01/2019 10:19    Procedures Procedures (including critical care time)  Medications Ordered in ED Medications  cefTRIAXone (ROCEPHIN) 1 g in sodium chloride 0.9 % 100 mL IVPB (has no administration in time range)  azithromycin (ZITHROMAX) 500 mg in sodium chloride 0.9 % 250 mL IVPB (500 mg Intravenous New Bag/Given 10/01/19 1110)  ondansetron (ZOFRAN) injection 4 mg (4 mg Intravenous Given 10/01/19 0938)  sodium chloride 0.9 % bolus  1,000 mL (1,000 mLs Intravenous New Bag/Given 10/01/19 1032)    ED Course  I have reviewed the triage vital signs and the nursing notes.  Pertinent labs & imaging results that were available during my care of the patient were reviewed by me and considered in my medical decision making (see chart for details).    MDM Rules/Calculators/A&P                          Patient is positive for COVID-19, his chest x-ray suggest additionally possible bacterial pneumonia with focal findings at his left base.  He was given IV Rocephin and Zithromax.  He was also given 1 L of IV fluids given his reported fevers and concern for sepsis.  Blood cultures are currently pending.  He presents with hypoxia at 87%.  Patient will require admission for supportive care during this illness.   Call to hospitalist for admission.  Discussed pt with Dr. Shari Heritage who accepts pt for admission.  Final Clinical Impression(s) / ED Diagnoses Final diagnoses:  COVID-19  Hypoxia  Nausea    Rx / DC Orders ED Discharge Orders    None       Victoriano Lain 10/01/19 1234    Bethann Berkshire, MD 10/02/19 [phone]

## 2019-10-01 NOTE — ED Triage Notes (Signed)
Pt reports cough, fever, sob, and o2 sat in 80's periodically.  Reports symptoms have been present for 1 1/2 weeks.  Reports had a negative covid test last Monday but had another test performed on Friday but was cancelled by mistake.  Pt has been on doxycycline for possible bacteria pneumonia.

## 2019-10-02 ENCOUNTER — Encounter (HOSPITAL_COMMUNITY): Payer: Self-pay | Admitting: Internal Medicine

## 2019-10-02 DIAGNOSIS — J9601 Acute respiratory failure with hypoxia: Secondary | ICD-10-CM | POA: Diagnosis not present

## 2019-10-02 DIAGNOSIS — U071 COVID-19: Principal | ICD-10-CM

## 2019-10-02 DIAGNOSIS — K219 Gastro-esophageal reflux disease without esophagitis: Secondary | ICD-10-CM | POA: Diagnosis not present

## 2019-10-02 LAB — COMPREHENSIVE METABOLIC PANEL
ALT: 55 U/L — ABNORMAL HIGH (ref 0–44)
AST: 35 U/L (ref 15–41)
Albumin: 3.2 g/dL — ABNORMAL LOW (ref 3.5–5.0)
Alkaline Phosphatase: 47 U/L (ref 38–126)
Anion gap: 9 (ref 5–15)
BUN: 16 mg/dL (ref 6–20)
CO2: 25 mmol/L (ref 22–32)
Calcium: 8.6 mg/dL — ABNORMAL LOW (ref 8.9–10.3)
Chloride: 103 mmol/L (ref 98–111)
Creatinine, Ser: 0.84 mg/dL (ref 0.61–1.24)
GFR calc Af Amer: >60 mL/min (ref >60)
GFR calc non Af Amer: >60 mL/min (ref >60)
Glucose, Bld: 158 mg/dL — ABNORMAL HIGH (ref 70–99)
Potassium: 4.4 mmol/L (ref 3.5–5.1)
Sodium: 137 mmol/L (ref 135–145)
Total Bilirubin: 0.7 mg/dL (ref 0.3–1.2)
Total Protein: 7.2 g/dL (ref 6.5–8.1)

## 2019-10-02 LAB — CBC WITH DIFFERENTIAL/PLATELET
Abs Immature Granulocytes: 0.04 10*3/uL (ref 0.00–0.07)
Basophils Absolute: 0 10*3/uL (ref 0.0–0.1)
Basophils Relative: 0 %
Eosinophils Absolute: 0 10*3/uL (ref 0.0–0.5)
Eosinophils Relative: 0 %
HCT: 40 % (ref 39.0–52.0)
Hemoglobin: 13.3 g/dL (ref 13.0–17.0)
Immature Granulocytes: 1 %
Lymphocytes Relative: 19 %
Lymphs Abs: 0.9 10*3/uL (ref 0.7–4.0)
MCH: 30 pg (ref 26.0–34.0)
MCHC: 33.3 g/dL (ref 30.0–36.0)
MCV: 90.3 fL (ref 80.0–100.0)
Monocytes Absolute: 0.3 10*3/uL (ref 0.1–1.0)
Monocytes Relative: 6 %
Neutro Abs: 3.8 10*3/uL (ref 1.7–7.7)
Neutrophils Relative %: 74 %
Platelets: 297 10*3/uL (ref 150–400)
RBC: 4.43 MIL/uL (ref 4.22–5.81)
RDW: 12.9 % (ref 11.5–15.5)
WBC: 5 10*3/uL (ref 4.0–10.5)
nRBC: 0 % (ref 0.0–0.2)

## 2019-10-02 LAB — PHOSPHORUS: Phosphorus: 3.2 mg/dL (ref 2.5–4.6)

## 2019-10-02 LAB — FERRITIN: Ferritin: 607 ng/mL — ABNORMAL HIGH (ref 24–336)

## 2019-10-02 LAB — MAGNESIUM: Magnesium: 2.5 mg/dL — ABNORMAL HIGH (ref 1.7–2.4)

## 2019-10-02 LAB — GLUCOSE, CAPILLARY
Glucose-Capillary: 144 mg/dL — ABNORMAL HIGH (ref 70–99)
Glucose-Capillary: 149 mg/dL — ABNORMAL HIGH (ref 70–99)

## 2019-10-02 LAB — D-DIMER, QUANTITATIVE: D-Dimer, Quant: 1.37 ug/mL-FEU — ABNORMAL HIGH (ref 0.00–0.50)

## 2019-10-02 LAB — C-REACTIVE PROTEIN: CRP: 11.6 mg/dL — ABNORMAL HIGH (ref <1.0)

## 2019-10-02 MED ORDER — IPRATROPIUM-ALBUTEROL 20-100 MCG/ACT IN AERS: 1.0000 | INHALATION_SPRAY | Freq: Four times a day (QID) | RESPIRATORY_TRACT | 0 refills | Status: AC | PRN

## 2019-10-02 MED ORDER — ONDANSETRON HCL 4 MG PO TABS: 4.0000 mg | ORAL_TABLET | Freq: Four times a day (QID) | ORAL | 0 refills | Status: DC | PRN

## 2019-10-02 MED ORDER — IPRATROPIUM-ALBUTEROL 20-100 MCG/ACT IN AERS: 1.0000 | INHALATION_SPRAY | Freq: Four times a day (QID) | RESPIRATORY_TRACT | Status: DC | PRN

## 2019-10-02 MED ORDER — DEXAMETHASONE 6 MG PO TABS
6.0000 mg | ORAL_TABLET | Freq: Every day | ORAL | 0 refills | Status: AC
Start: 2019-10-03 — End: 2019-10-11

## 2019-10-02 MED ORDER — MELOXICAM 15 MG PO TABS: 15.0000 mg | ORAL_TABLET | Freq: Every day | ORAL | 2 refills | Status: AC

## 2019-10-02 MED ORDER — HYDROCOD POLST-CPM POLST ER 10-8 MG/5ML PO SUER: 5.0000 mL | Freq: Two times a day (BID) | ORAL | 0 refills | Status: DC | PRN

## 2019-10-02 NOTE — Progress Notes (Signed)
Patient scheduled for outpatient Remdesivir infusions on 8/19, 8/20, and 8/21at 11am at [facility]. Please inform the patient to park at [address] [place], [place], as staff will be escorting the patient through the east entrance of the hospital.    There is a wave flag banner located near the entrance on [address]. Turn into this entrance and immediately turn left and park in 1 of the 5 designated Covid Infusion Parking spots. There is a phone number on the sign, please call and let the staff know what spot you are in and we will come out and get you. For questions call [phone].  Thanks.

## 2019-10-02 NOTE — Discharge Summary (Signed)
Physician Discharge Summary  Gregory Lyons:[id number] DOB: 1975-06-13 DOA: 10/01/2019  PCP: Donita Brooks, MD  Admit date: 10/01/2019 Discharge date: 10/02/2019   Admitted From:  Home  Disposition:  Home   Recommendations for Outpatient Follow-up:  1. Follow up with PCP in 2 weeks 2. Quarantine for 14 days 3. Please complete remaining outpatient remdesivir treatments   Discharge Condition: STABLE   CODE STATUS: FULL    Brief Hospitalization Summary: Please see all hospital notes, images, labs for full details of the hospitalization. ADMISSION HPI: Gregory Lyons is a 44 y.o. male with medical history significant of GERD, obstructive sleep apnea on CPAP comes to the hospital with complains of shortness of breath.  Patient states he has had intermittent fevers, chills, body ache and progressive shortness of breath for the past week.  He went to go see his PCP about a week ago and his COVID-19 test was negative.  His symptoms continued therefore went again to go see PCP when he was prescribed doxycycline which she was only taken 1 dose of.  Due to persistence of his symptoms, poor oral intake he came to the hospital for further evaluation  Patient has not been vaccinated for COVID-19  In the ER his routine blood work was unremarkable besides elevated inflammatory markers mild lactic acidosis.  Chest x-ray showed left lower lobe infiltrate.  He was desaturating down to 85% on room air.  Medical team requested to admit the patient.  Received 1 dose of IV azithromycin and Rocephin in the ER.  Social history-denies any alcohol, tobacco and illicit drug use  Hospital Course:  The patient was admitted with acute resp distress with hypoxia due to Covid 19 infection and was briefly on supplemental oxygen however that was rapidly weaned down to room air.  He was treated with remdesivir and IV Solu-Medrol with good results.  He did not receive Actemra.  He also received antibiotics but  they were not continued due to low procalcitonin levels and likely all viral related illness.   He was given supportive care with good improvement in symptoms.  He has been on room air for more than 18 hours now.  He says that he feels great.  His labs are stable.  He would like to discharge home today.  We are making arrangements for him to complete outpatient remdesivir treatments at the [facility].  His other medical conditions have been stable.  He has been ambulating in the room with no difficulty.  We will discharge him home today.  Discharge Diagnoses:  Principal Problem:   Acute respiratory failure with hypoxia (HCC) Active Problems:   GERD (gastroesophageal reflux disease)   COVID-19 virus infection  Discharge Instructions:  Allergies as of 10/02/2019   No Known Allergies     Medication List    STOP taking these medications   benzonatate 100 MG capsule Commonly known as: TESSALON   doxycycline 100 MG tablet Commonly known as: VIBRA-TABS     TAKE these medications   chlorpheniramine-HYDROcodone 10-8 MG/5ML Suer Commonly known as: TUSSIONEX Take 5 mLs by mouth every 12 (twelve) hours as needed for cough.   dexamethasone 6 MG tablet Commonly known as: DECADRON Take 1 tablet (6 mg total) by mouth daily for 8 doses. Start taking on: October 03, 2019   ipratropium 0.06 % nasal spray Commonly known as: ATROVENT Place 2 sprays into both nostrils 3 (three) times daily.   Ipratropium-Albuterol 20-100 MCG/ACT Aers respimat Commonly known as: COMBIVENT  Inhale 1 puff into the lungs every 6 (six) hours as needed for wheezing or shortness of breath.   meloxicam 15 MG tablet Commonly known as: MOBIC Take 1 tablet (15 mg total) by mouth daily. Start taking on: October 11, 2019 What changed:   See the new instructions.  These instructions start on October 11, 2019. If you are unsure what to do until then, ask your doctor or other care provider.   omeprazole 40 MG  capsule Commonly known as: PRILOSEC TAKE 1 CAPSULE(40 MG) BY MOUTH TWICE DAILY What changed: See the new instructions.   ondansetron 4 MG tablet Commonly known as: ZOFRAN Take 1 tablet (4 mg total) by mouth every 6 (six) hours as needed for nausea or vomiting.       Follow-up Information    Donita Brooks, MD. Schedule an appointment as soon as possible for a visit in 2 week(s).   Specialty: Family Medicine Why: Hospital Follow Up  Contact information: [address] [place] [address] [phone]              No Known Allergies Allergies as of 10/02/2019   No Known Allergies     Medication List    STOP taking these medications   benzonatate 100 MG capsule Commonly known as: TESSALON   doxycycline 100 MG tablet Commonly known as: VIBRA-TABS     TAKE these medications   chlorpheniramine-HYDROcodone 10-8 MG/5ML Suer Commonly known as: TUSSIONEX Take 5 mLs by mouth every 12 (twelve) hours as needed for cough.   dexamethasone 6 MG tablet Commonly known as: DECADRON Take 1 tablet (6 mg total) by mouth daily for 8 doses. Start taking on: October 03, 2019   ipratropium 0.06 % nasal spray Commonly known as: ATROVENT Place 2 sprays into both nostrils 3 (three) times daily.   Ipratropium-Albuterol 20-100 MCG/ACT Aers respimat Commonly known as: COMBIVENT Inhale 1 puff into the lungs every 6 (six) hours as needed for wheezing or shortness of breath.   meloxicam 15 MG tablet Commonly known as: MOBIC Take 1 tablet (15 mg total) by mouth daily. Start taking on: October 11, 2019 What changed:   See the new instructions.  These instructions start on October 11, 2019. If you are unsure what to do until then, ask your doctor or other care provider.   omeprazole 40 MG capsule Commonly known as: PRILOSEC TAKE 1 CAPSULE(40 MG) BY MOUTH TWICE DAILY What changed: See the new instructions.   ondansetron 4 MG tablet Commonly known as: ZOFRAN Take 1 tablet  (4 mg total) by mouth every 6 (six) hours as needed for nausea or vomiting.       Procedures/Studies: DG Chest Port 1 View  Result Date: 10/01/2019 CLINICAL DATA:  Fever.  Shortness of breath.  Questionable sepsis. EXAM: PORTABLE CHEST 1 VIEW COMPARISON:  No prior. FINDINGS: Mediastinum and hilar structures normal. Low lung volumes. Mild left base infiltrate. No pleural effusion or pneumothorax. Biapical pleural thickening consistent scarring. Cardiomegaly. No pulmonary venous congestion. No acute bony abnormality. IMPRESSION: Lung volumes.  Mild left base infiltrate suggesting pneumonia. Electronically Signed   By: Maisie Fus  Register   On: 10/01/2019 10:19      Subjective: Pt says that he feels great today.  He wants to go home.  He is having no respiratory symptoms at all.  He is on room air and has been ambulating about in the room.   Discharge Exam: Vitals:   10/02/19 0729 10/02/19 0900  BP:  118/86  Pulse:  94  Resp:  18  Temp:  98 F (36.7 C)  SpO2: 95% 90%   Vitals:   10/02/19 0047 10/02/19 0451 10/02/19 0729 10/02/19 0900  BP: 126/84 124/87  118/86  Pulse: 86 90  94  Resp: 20 18  18   Temp: 98.1 F (36.7 C) 98.2 F (36.8 C)  98 F (36.7 C)  TempSrc: Oral Oral  Oral  SpO2: 91% 93% 95% 90%  Weight:      Height:       General: Pt is alert, awake, not in acute distress Cardiovascular: RRR, S1/S2 +, no rubs, no gallops Respiratory: no increased work of breathing, no wheezing, no rhonchi Abdominal: Soft, NT, ND, bowel sounds + Extremities: no edema, no cyanosis   The results of significant diagnostics from this hospitalization (including imaging, microbiology, ancillary and laboratory) are listed below for reference.     Microbiology: Recent Results (from the past 240 hour(s))  SARS-COV-2 RNA,(COVID-19) QUAL NAAT     Status: None   Collection Time: 09/27/19 11:24 AM   Specimen: Respiratory  Result Value Ref Range Status   SARS CoV2 RNA CANCELED      Comment: TEST  NOT PERFORMED . Quantity not sufficient.  Result canceled by the ancillary.   SARS Coronavirus 2 by RT PCR (hospital order, performed in [facility] hospital lab) Nasopharyngeal Nasopharyngeal Swab     Status: Abnormal   Collection Time: 10/01/19  9:26 AM   Specimen: Nasopharyngeal Swab  Result Value Ref Range Status   SARS Coronavirus 2 POSITIVE (A) NEGATIVE Final    Comment: RESULT CALLED TO, READ BACK BY AND VERIFIED WITH: CARDWELL L. @ 1107 ON [id number] BY HENDERSON L. (NOTE) SARS-CoV-2 target nucleic acids are DETECTED  SARS-CoV-2 RNA is generally detectable in upper respiratory specimens  during the acute phase of infection.  Positive results are indicative  of the presence of the identified virus, but do not rule out bacterial infection or co-infection with other pathogens not detected by the test.  Clinical correlation with patient history and  other diagnostic information is necessary to determine patient infection status.  The expected result is negative.  Fact Sheet for Patients:   BoilerBrush.com.cyhttps://www.fda.gov/media/136312/download   Fact Sheet for Healthcare Providers:   https://pope.com/https://www.fda.gov/media/136313/download    This test is not yet approved or cleared by the [place] FDA and  has been authorized for detection and/or diagnosis of SARS-CoV-2 by FDA under an Emergency Use Authorization (EUA).  This EUA will remain in effect (meani ng this test can be used) for the duration of  the COVID-19 declaration under Section 564(b)(1) of the Act, 21 U.S.C. section 360-bbb-3(b)(1), unless the authorization is terminated or revoked sooner.  Performed at [facility], [address] [place], [place] [address]   Blood culture (routine x 2)     Status: None (Preliminary result)   Collection Time: 10/01/19  9:49 AM   Specimen: Left Antecubital; Blood  Result Value Ref Range Status   Specimen Description   Final    LEFT ANTECUBITAL BOTTLES DRAWN AEROBIC AND ANAEROBIC   Special  Requests   Final    Blood Culture results may not be optimal due to an inadequate volume of blood received in culture bottles   Culture   Final    NO GROWTH < 24 HOURS Performed at [facility], [address] [place], [place] [address]    Report Status PENDING  Incomplete  Blood culture (routine x 2)  Status: None (Preliminary result)   Collection Time: 10/01/19 10:05 AM   Specimen: Right Antecubital; Blood  Result Value Ref Range Status   Specimen Description   Final    RIGHT ANTECUBITAL BOTTLES DRAWN AEROBIC AND ANAEROBIC   Special Requests Blood Culture adequate volume  Final   Culture   Final    NO GROWTH < 24 HOURS Performed at [facility], [address], [place], [place] [address]    Report Status PENDING  Incomplete     Labs: BNP (last 3 results) Recent Labs    10/01/19 1308  BNP 36.0   Basic Metabolic Panel: Recent Labs  Lab 10/01/19 0948 10/02/19 0559  NA 137 137  K 3.7 4.4  CL 100 103  CO2 22 25  GLUCOSE 124* 158*  BUN 15 16  CREATININE 0.81 0.84  CALCIUM 8.9 8.6*  MG  --  2.5*  PHOS  --  3.2   Liver Function Tests: Recent Labs  Lab 10/01/19 0948 10/02/19 0559  AST 51* 35  ALT 55* 55*  ALKPHOS 42 47  BILITOT 1.3* 0.7  PROT 7.9 7.2  ALBUMIN 3.9 3.2*   No results for input(s): LIPASE, AMYLASE in the last 168 hours. No results for input(s): AMMONIA in the last 168 hours. CBC: Recent Labs  Lab 10/01/19 0948 10/02/19 0559  WBC 8.0 5.0  NEUTROABS 5.8 3.8  HGB 15.4 13.3  HCT 45.4 40.0  MCV 89.4 90.3  PLT 246 297   Cardiac Enzymes: No results for input(s): CKTOTAL, CKMB, CKMBINDEX, TROPONINI in the last 168 hours. BNP: Invalid input(s): POCBNP CBG: Recent Labs  Lab 10/01/19 1302 10/01/19 1701 10/01/19 2107 10/02/19 0806  GLUCAP 96 144* 181* 144*   D-Dimer Recent Labs    10/01/19 1308 10/02/19 0559  DDIMER 1.39* 1.37*   Hgb A1c No results for input(s): HGBA1C in the last 72 hours. Lipid Profile No results for input(s):  CHOL, HDL, LDLCALC, TRIG, CHOLHDL, LDLDIRECT in the last 72 hours. Thyroid function studies No results for input(s): TSH, T4TOTAL, T3FREE, THYROIDAB in the last 72 hours.  Invalid input(s): FREET3 Anemia work up Recent Labs    10/01/19 1308 10/02/19 0559  FERRITIN 642* 607*   Urinalysis    Component Value Date/Time   COLORURINE AMBER (A) 10/01/2019 1636   APPEARANCEUR CLEAR 10/01/2019 1636   LABSPEC 1.021 10/01/2019 1636   PHURINE 6.0 10/01/2019 1636   GLUCOSEU NEGATIVE 10/01/2019 1636   HGBUR NEGATIVE 10/01/2019 1636   BILIRUBINUR NEGATIVE 10/01/2019 1636   KETONESUR 5 (A) 10/01/2019 1636   PROTEINUR 30 (A) 10/01/2019 1636   NITRITE NEGATIVE 10/01/2019 1636   LEUKOCYTESUR NEGATIVE 10/01/2019 1636   Sepsis Labs Invalid input(s): PROCALCITONIN,  WBC,  LACTICIDVEN Microbiology Recent Results (from the past 240 hour(s))  SARS-COV-2 RNA,(COVID-19) QUAL NAAT     Status: None   Collection Time: 09/27/19 11:24 AM   Specimen: Respiratory  Result Value Ref Range Status   SARS CoV2 RNA CANCELED      Comment: TEST NOT PERFORMED . Quantity not sufficient.  Result canceled by the ancillary.   SARS Coronavirus 2 by RT PCR (hospital order, performed in [facility] hospital lab) Nasopharyngeal Nasopharyngeal Swab     Status: Abnormal   Collection Time: 10/01/19  9:26 AM   Specimen: Nasopharyngeal Swab  Result Value Ref Range Status   SARS Coronavirus 2 POSITIVE (A) NEGATIVE Final    Comment: RESULT CALLED TO, READ BACK BY AND VERIFIED WITH: CARDWELL L. @ 1107 ON [id number] BY HENDERSON  L. (NOTE) SARS-CoV-2 target nucleic acids are DETECTED  SARS-CoV-2 RNA is generally detectable in upper respiratory specimens  during the acute phase of infection.  Positive results are indicative  of the presence of the identified virus, but do not rule out bacterial infection or co-infection with other pathogens not detected by the test.  Clinical correlation with patient history and  other  diagnostic information is necessary to determine patient infection status.  The expected result is negative.  Fact Sheet for Patients:   BoilerBrush.com.cy   Fact Sheet for Healthcare Providers:   https://pope.com/    This test is not yet approved or cleared by the [place] FDA and  has been authorized for detection and/or diagnosis of SARS-CoV-2 by FDA under an Emergency Use Authorization (EUA).  This EUA will remain in effect (meani ng this test can be used) for the duration of  the COVID-19 declaration under Section 564(b)(1) of the Act, 21 U.S.C. section 360-bbb-3(b)(1), unless the authorization is terminated or revoked sooner.  Performed at [facility], [address], [place], [place] [address]   Blood culture (routine x 2)     Status: None (Preliminary result)   Collection Time: 10/01/19  9:49 AM   Specimen: Left Antecubital; Blood  Result Value Ref Range Status   Specimen Description   Final    LEFT ANTECUBITAL BOTTLES DRAWN AEROBIC AND ANAEROBIC   Special Requests   Final    Blood Culture results may not be optimal due to an inadequate volume of blood received in culture bottles   Culture   Final    NO GROWTH < 24 HOURS Performed at [facility], [address] [place], [place] [address]    Report Status PENDING  Incomplete  Blood culture (routine x 2)     Status: None (Preliminary result)   Collection Time: 10/01/19 10:05 AM   Specimen: Right Antecubital; Blood  Result Value Ref Range Status   Specimen Description   Final    RIGHT ANTECUBITAL BOTTLES DRAWN AEROBIC AND ANAEROBIC   Special Requests Blood Culture adequate volume  Final   Culture   Final    NO GROWTH < 24 HOURS Performed at [facility], [address], [place], [place] [address]    Report Status PENDING  Incomplete   Time coordinating discharge: 35 minutes   SIGNED:  Standley Dakins, MD  [facility] 10/02/2019, 11:39 AM How to contact  the [facility] Attending or Consulting provider 7A - 7P or covering provider during after hours 7P -7A, for this patient?  1. Check the care team in [facility] and look for a) attending/consulting [facility] provider listed and b) the [facility] team listed 2. Log into [email or web address] and use [place]'s universal password to access. If you do not have the password, please contact the hospital operator. 3. Locate the [facility] provider you are looking for under [facility] and page to a number that you can be directly reached. 4. If you still have difficulty reaching the provider, please page the [facility] (Director on Call) for the Hospitalists listed on amion for assistance.

## 2019-10-02 NOTE — Discharge Instructions (Signed)
Patient scheduled for outpatient Remdesivir infusions on 8/19, 8/20, and 8/21at 11am at [facility]. Please inform the patient to park at [address] [place], [place], as staff will be escorting the patient through the east entrance of the hospital.    There is a wave flag banner located near the entrance on [address]. Turn into this entrance and immediately turn left and park in 1 of the 5 designated Covid Infusion Parking spots. There is a phone number on the sign, please call and let the staff know what spot you are in and we will come out and get you. For questions call [phone].  Thanks.    10 Things You Can Do to Manage Your COVID-19 Symptoms at Home If you have possible or confirmed COVID-19: 1. Stay home from work and school. And stay away from other public places. If you must go out, avoid using any kind of public transportation, ridesharing, or taxis. 2. Monitor your symptoms carefully. If your symptoms get worse, call your healthcare provider immediately. 3. Get rest and stay hydrated. 4. If you have a medical appointment, call the healthcare provider ahead of time and tell them that you have or may have COVID-19. 5. For medical emergencies, call 911 and notify the dispatch personnel that you have or may have COVID-19. 6. Cover your cough and sneezes with a tissue or use the inside of your elbow. 7. Wash your hands often with soap and water for at least 20 seconds or clean your hands with an alcohol-based hand sanitizer that contains at least 60% alcohol. 8. As much as possible, stay in a specific room and away from other people in your home. Also, you should use a separate bathroom, if available. If you need to be around other people in or outside of the home, wear a mask. 9. Avoid sharing personal items with other people in your household, like dishes, towels, and bedding. 10. Clean all surfaces that are touched often, like counters, tabletops, and doorknobs. Use household  cleaning sprays or wipes according to the label instructions. [email or web address].co.uk 08/15/2018 This information is not intended to replace advice given to you by your health care provider. Make sure you discuss any questions you have with your health care provider. Document Revised: 01/17/2019 Document Reviewed: 01/17/2019 Elsevier Patient Education  2020 Elsevier Inc.   COVID-19 COVID-19 is a respiratory infection that is caused by a virus called severe acute respiratory syndrome coronavirus 2 (SARS-CoV-2). The disease is also known as coronavirus disease or novel coronavirus. In some people, the virus may not cause any symptoms. In others, it may cause a serious infection. The infection can get worse quickly and can lead to complications, such as:  Pneumonia, or infection of the lungs.  Acute respiratory distress syndrome or ARDS. This is a condition in which fluid build-up in the lungs prevents the lungs from filling with air and passing oxygen into the blood.  Acute respiratory failure. This is a condition in which there is not enough oxygen passing from the lungs to the body or when carbon dioxide is not passing from the lungs out of the body.  Sepsis or septic shock. This is a serious bodily reaction to an infection.  Blood clotting problems.  Secondary infections due to bacteria or fungus.  Organ failure. This is when your body's organs stop working. The virus that causes COVID-19 is contagious. This means that it can spread from person to person through droplets from coughs and sneezes (respiratory  secretions). What are the causes? This illness is caused by a virus. You may catch the virus by:  Breathing in droplets from an infected person. Droplets can be spread by a person breathing, speaking, singing, coughing, or sneezing.  Touching something, like a table or a doorknob, that was exposed to the virus (contaminated) and then touching your mouth, nose, or eyes. What increases  the risk? Risk for infection You are more likely to be infected with this virus if you:  Are within 6 feet (2 meters) of a person with COVID-19.  Provide care for or live with a person who is infected with COVID-19.  Spend time in crowded indoor spaces or live in shared housing. Risk for serious illness You are more likely to become seriously ill from the virus if you:  Are 68 years of age or older. The higher your age, the more you are at risk for serious illness.  Live in a nursing home or long-term care facility.  Have cancer.  Have a long-term (chronic) disease such as: ? Chronic lung disease, including chronic obstructive pulmonary disease or asthma. ? A long-term disease that lowers your body's ability to fight infection (immunocompromised). ? Heart disease, including heart failure, a condition in which the arteries that lead to the heart become narrow or blocked (coronary artery disease), a disease which makes the heart muscle thick, weak, or stiff (cardiomyopathy). ? Diabetes. ? Chronic kidney disease. ? Sickle cell disease, a condition in which red blood cells have an abnormal "sickle" shape. ? Liver disease.  Are obese. What are the signs or symptoms? Symptoms of this condition can range from mild to severe. Symptoms may appear any time from 2 to 14 days after being exposed to the virus. They include:  A fever or chills.  A cough.  Difficulty breathing.  Headaches, body aches, or muscle aches.  Runny or stuffy (congested) nose.  A sore throat.  New loss of taste or smell. Some people may also have stomach problems, such as nausea, vomiting, or diarrhea. Other people may not have any symptoms of COVID-19. How is this diagnosed? This condition may be diagnosed based on:  Your signs and symptoms, especially if: ? You live in an area with a COVID-19 outbreak. ? You recently traveled to or from an area where the virus is common. ? You provide care for or live  with a person who was diagnosed with COVID-19. ? You were exposed to a person who was diagnosed with COVID-19.  A physical exam.  Lab tests, which may include: ? Taking a sample of fluid from the back of your nose and throat (nasopharyngeal fluid), your nose, or your throat using a swab. ? A sample of mucus from your lungs (sputum). ? Blood tests.  Imaging tests, which may include, X-rays, CT scan, or ultrasound. How is this treated? At present, there is no medicine to treat COVID-19. Medicines that treat other diseases are being used on a trial basis to see if they are effective against COVID-19. Your health care provider will talk with you about ways to treat your symptoms. For most people, the infection is mild and can be managed at home with rest, fluids, and over-the-counter medicines. Treatment for a serious infection usually takes places in a hospital intensive care unit (ICU). It may include one or more of the following treatments. These treatments are given until your symptoms improve.  Receiving fluids and medicines through an IV.  Supplemental oxygen. Extra oxygen is given  through a tube in the nose, a face mask, or a hood.  Positioning you to lie on your stomach (prone position). This makes it easier for oxygen to get into the lungs.  Continuous positive airway pressure (CPAP) or bi-level positive airway pressure (BPAP) machine. This treatment uses mild air pressure to keep the airways open. A tube that is connected to a motor delivers oxygen to the body.  Ventilator. This treatment moves air into and out of the lungs by using a tube that is placed in your windpipe.  Tracheostomy. This is a procedure to create a hole in the neck so that a breathing tube can be inserted.  Extracorporeal membrane oxygenation (ECMO). This procedure gives the lungs a chance to recover by taking over the functions of the heart and lungs. It supplies oxygen to the body and removes carbon  dioxide. Follow these instructions at home: Lifestyle  If you are sick, stay home except to get medical care. Your health care provider will tell you how long to stay home. Call your health care provider before you go for medical care.  Rest at home as told by your health care provider.  Do not use any products that contain nicotine or tobacco, such as cigarettes, e-cigarettes, and chewing tobacco. If you need help quitting, ask your health care provider.  Return to your normal activities as told by your health care provider. Ask your health care provider what activities are safe for you. General instructions  Take over-the-counter and prescription medicines only as told by your health care provider.  Drink enough fluid to keep your urine pale yellow.  Keep all follow-up visits as told by your health care provider. This is important. How is this prevented?  There is no vaccine to help prevent COVID-19 infection. However, there are steps you can take to protect yourself and others from this virus. To protect yourself:   Do not travel to areas where COVID-19 is a risk. The areas where COVID-19 is reported change often. To identify high-risk areas and travel restrictions, check the CDC travel website: StageSync.si  If you live in, or must travel to, an area where COVID-19 is a risk, take precautions to avoid infection. ? Stay away from people who are sick. ? Wash your hands often with soap and water for 20 seconds. If soap and water are not available, use an alcohol-based hand sanitizer. ? Avoid touching your mouth, face, eyes, or nose. ? Avoid going out in public, follow guidance from your state and local health authorities. ? If you must go out in public, wear a cloth face covering or face mask. Make sure your mask covers your nose and mouth. ? Avoid crowded indoor spaces. Stay at least 6 feet (2 meters) away from others. ? Disinfect objects and surfaces that are  frequently touched every day. This may include:  Counters and tables.  Doorknobs and light switches.  Sinks and faucets.  Electronics, such as phones, remote controls, keyboards, computers, and tablets. To protect others: If you have symptoms of COVID-19, take steps to prevent the virus from spreading to others.  If you think you have a COVID-19 infection, contact your health care provider right away. Tell your health care team that you think you may have a COVID-19 infection.  Stay home. Leave your house only to seek medical care. Do not use public transport.  Do not travel while you are sick.  Wash your hands often with soap and water for 20 seconds. If  soap and water are not available, use alcohol-based hand sanitizer.  Stay away from other members of your household. Let healthy household members care for children and pets, if possible. If you have to care for children or pets, wash your hands often and wear a mask. If possible, stay in your own room, separate from others. Use a different bathroom.  Make sure that all people in your household wash their hands well and often.  Cough or sneeze into a tissue or your sleeve or elbow. Do not cough or sneeze into your hand or into the air.  Wear a cloth face covering or face mask. Make sure your mask covers your nose and mouth. Where to find more information  Centers for Disease Control and Prevention: StickerEmporium.tn  World Health Organization: https://thompson-craig.com/ Contact a health care provider if:  You live in or have traveled to an area where COVID-19 is a risk and you have symptoms of the infection.  You have had contact with someone who has COVID-19 and you have symptoms of the infection. Get help right away if:  You have trouble breathing.  You have pain or pressure in your chest.  You have confusion.  You have bluish lips and fingernails.  You have difficulty waking  from sleep.  You have symptoms that get worse. These symptoms may represent a serious problem that is an emergency. Do not wait to see if the symptoms will go away. Get medical help right away. Call your local emergency services (911 in the [place] Do not drive yourself to the hospital. Let the emergency medical personnel know if you think you have COVID-19. Summary  COVID-19 is a respiratory infection that is caused by a virus. It is also known as coronavirus disease or novel coronavirus. It can cause serious infections, such as pneumonia, acute respiratory distress syndrome, acute respiratory failure, or sepsis.  The virus that causes COVID-19 is contagious. This means that it can spread from person to person through droplets from breathing, speaking, singing, coughing, or sneezing.  You are more likely to develop a serious illness if you are 74 years of age or older, have a weak immune system, live in a nursing home, or have chronic disease.  There is no medicine to treat COVID-19. Your health care provider will talk with you about ways to treat your symptoms.  Take steps to protect yourself and others from infection. Wash your hands often and disinfect objects and surfaces that are frequently touched every day. Stay away from people who are sick and wear a mask if you are sick. This information is not intended to replace advice given to you by your health care provider. Make sure you discuss any questions you have with your health care provider. Document Revised: 11/30/2018 Document Reviewed: 03/08/2018 Elsevier Patient Education  2020 Elsevier Inc.  COVID-19: How to Protect Yourself and Others Know how it spreads  There is currently no vaccine to prevent coronavirus disease 2019 (COVID-19).  The best way to prevent illness is to avoid being exposed to this virus.  The virus is thought to spread mainly from person-to-person. ? Between people who are in close contact with one another  (within about 6 feet). ? Through respiratory droplets produced when an infected person coughs, sneezes or talks. ? These droplets can land in the mouths or noses of people who are nearby or possibly be inhaled into the lungs. ? COVID-19 may be spread by people who are not showing symptoms. Everyone should Clean  your hands often  Wash your hands often with soap and water for at least 20 seconds especially after you have been in a public place, or after blowing your nose, coughing, or sneezing.  If soap and water are not readily available, use a hand sanitizer that contains at least 60% alcohol. Cover all surfaces of your hands and rub them together until they feel dry.  Avoid touching your eyes, nose, and mouth with unwashed hands. Avoid close contact  Limit contact with others as much as possible.  Avoid close contact with people who are sick.  Put distance between yourself and other people. ? Remember that some people without symptoms may be able to spread virus. ? This is especially important for people who are at higher risk of getting very RetroStamps.it Cover your mouth and nose with a mask when around others  You could spread COVID-19 to others even if you do not feel sick.  Everyone should wear a mask in public settings and when around people not living in their household, especially when social distancing is difficult to maintain. ? Masks should not be placed on young children under age 98, anyone who has trouble breathing, or is unconscious, incapacitated or otherwise unable to remove the mask without assistance.  The mask is meant to protect other people in case you are infected.  Do NOT use a facemask meant for a Research scientist (physical sciences).  Continue to keep about 6 feet between yourself and others. The mask is not a substitute for social distancing. Cover coughs and sneezes  Always cover your mouth and nose  with a tissue when you cough or sneeze or use the inside of your elbow.  Throw used tissues in the trash.  Immediately wash your hands with soap and water for at least 20 seconds. If soap and water are not readily available, clean your hands with a hand sanitizer that contains at least 60% alcohol. Clean and disinfect  Clean AND disinfect frequently touched surfaces daily. This includes tables, doorknobs, light switches, countertops, handles, desks, phones, keyboards, toilets, faucets, and sinks. ktimeonline.com  If surfaces are dirty, clean them: Use detergent or soap and water prior to disinfection.  Then, use a household disinfectant. You can see a list of EPA-registered household disinfectants here. SouthAmericaFlowers.co.uk 10/17/2018 This information is not intended to replace advice given to you by your health care provider. Make sure you discuss any questions you have with your health care provider. Document Revised: 10/25/2018 Document Reviewed: 08/23/2018 Elsevier Patient Education  2020 [employer].   COVID-19 Frequently Asked Questions COVID-19 (coronavirus disease) is an infection that is caused by a large family of viruses. Some viruses cause illness in people and others cause illness in animals like camels, cats, and bats. In some cases, the viruses that cause illness in animals can spread to humans. Where did the coronavirus come from? In December 2019, [place] told the [employer] [facility]) of several cases of lung disease (human respiratory illness). These cases were linked to an open seafood and livestock market in the city of [place]. The link to the seafood and livestock market suggests that the virus may have spread from animals to humans. However, since that first outbreak in December, the virus has also been shown to spread from person to person. What is the name of the disease and the virus? Disease  name Early on, this disease was called novel coronavirus. This is because scientists determined that the disease was caused by a new (novel)  respiratory virus. The World Health Organization [facility]) has now named the disease COVID-19, or coronavirus disease. Virus name The virus that causes the disease is called severe acute respiratory syndrome coronavirus 2 (SARS-CoV-2). More information on disease and virus naming World Health Organization [facility]): www.who.int/emergencies/diseases/novel-coronavirus-2019/technical-guidance/naming-the-coronavirus-disease-(covid-2019)-and-the-virus-that-causes-it Who is at risk for complications from coronavirus disease? Some people may be at higher risk for complications from coronavirus disease. This includes older adults and people who have chronic diseases, such as heart disease, diabetes, and lung disease. If you are at higher risk for complications, take these extra precautions:  Stay home as much as possible.  Avoid social gatherings and travel.  Avoid close contact with others. Stay at least 6 ft (2 m) away from others, if possible.  Wash your hands often with soap and water for at least 20 seconds.  Avoid touching your face, mouth, nose, or eyes.  Keep supplies on hand at home, such as food, medicine, and cleaning supplies.  If you must go out in public, wear a cloth face covering or face mask. Make sure your mask covers your nose and mouth. How does coronavirus disease spread? The virus that causes coronavirus disease spreads easily from person to person (is contagious). You may catch the virus by:  Breathing in droplets from an infected person. Droplets can be spread by a person breathing, speaking, singing, coughing, or sneezing.  Touching something, like a table or a doorknob, that was exposed to the virus (contaminated) and then touching your mouth, nose, or eyes. Can I get the virus from touching surfaces or objects? There is still a lot that  we do not know about the virus that causes coronavirus disease. Scientists are basing a lot of information on what they know about similar viruses, such as:  Viruses cannot generally survive on surfaces for long. They need a human body (host) to survive.  It is more likely that the virus is spread by close contact with people who are sick (direct contact), such as through: ? Shaking hands or hugging. ? Breathing in respiratory droplets that travel through the air. Droplets can be spread by a person breathing, speaking, singing, coughing, or sneezing.  It is less likely that the virus is spread when a person touches a surface or object that has the virus on it (indirect contact). The virus may be able to enter the body if the person touches a surface or object and then touches his or her face, eyes, nose, or mouth. Can a person spread the virus without having symptoms of the disease? It may be possible for the virus to spread before a person has symptoms of the disease, but this is most likely not the main way the virus is spreading. It is more likely for the virus to spread by being in close contact with people who are sick and breathing in the respiratory droplets spread by a person breathing, speaking, singing, coughing, or sneezing. What are the symptoms of coronavirus disease? Symptoms vary from person to person and can range from mild to severe. Symptoms may include:  Fever or chills.  Cough.  Difficulty breathing or feeling short of breath.  Headaches, body aches, or muscle aches.  Runny or stuffy (congested) nose.  Sore throat.  New loss of taste or smell.  Nausea, vomiting, or diarrhea. These symptoms can appear anywhere from 2 to 14 days after you have been exposed to the virus. Some people may not have any symptoms. If you develop symptoms, call your health care  provider. People with severe symptoms may need hospital care. Should I be tested for this virus? Your health care  provider will decide whether to test you based on your symptoms, history of exposure, and your risk factors. How does a health care provider test for this virus? Health care providers will collect samples to send for testing. Samples may include:  Taking a swab of fluid from the back of your nose and throat, your nose, or your throat.  Taking fluid from the lungs by having you cough up mucus (sputum) into a sterile cup.  Taking a blood sample. Is there a treatment or vaccine for this virus? Currently, there is no vaccine to prevent coronavirus disease. Also, there are no medicines like antibiotics or antivirals to treat the virus. A person who becomes sick is given supportive care, which means rest and fluids. A person may also relieve his or her symptoms by using over-the-counter medicines that treat sneezing, coughing, and runny nose. These are the same medicines that a person takes for the common cold. If you develop symptoms, call your health care provider. People with severe symptoms may need hospital care. What can I do to protect myself and my family from this virus?     You can protect yourself and your family by taking the same actions that you would take to prevent the spread of other viruses. Take the following actions:  Wash your hands often with soap and water for at least 20 seconds. If soap and water are not available, use alcohol-based hand sanitizer.  Avoid touching your face, mouth, nose, or eyes.  Cough or sneeze into a tissue, sleeve, or elbow. Do not cough or sneeze into your hand or the air. ? If you cough or sneeze into a tissue, throw it away immediately and wash your hands.  Disinfect objects and surfaces that you frequently touch every day.  Stay away from people who are sick.  Avoid going out in public, follow guidance from your state and local health authorities.  Avoid crowded indoor spaces. Stay at least 6 ft (2 m) away from others.  If you must go out  in public, wear a cloth face covering or face mask. Make sure your mask covers your nose and mouth.  Stay home if you are sick, except to get medical care. Call your health care provider before you get medical care. Your health care provider will tell you how long to stay home.  Make sure your vaccines are up to date. Ask your health care provider what vaccines you need. What should I do if I need to travel? Follow travel recommendations from your local health authority, the CDC, and WHO. Travel information and advice  Centers for Disease Control and Prevention (CDC): GeminiCard.glwww.cdc.gov/coronavirus/2019-ncov/travelers/index.html  World Health Organization [facility](WHO): PreviewDomains.sewww.who.int/emergencies/diseases/novel-coronavirus-2019/travel-advice Know the risks and take action to protect your health  You are at higher risk of getting coronavirus disease if you are traveling to areas with an outbreak or if you are exposed to travelers from areas with an outbreak.  Wash your hands often and practice good hygiene to lower the risk of catching or spreading the virus. What should I do if I am sick? General instructions to stop the spread of infection  Wash your hands often with soap and water for at least 20 seconds. If soap and water are not available, use alcohol-based hand sanitizer.  Cough or sneeze into a tissue, sleeve, or elbow. Do not cough or sneeze into your hand or  the air.  If you cough or sneeze into a tissue, throw it away immediately and wash your hands.  Stay home unless you must get medical care. Call your health care provider or local health authority before you get medical care.  Avoid public areas. Do not take public transportation, if possible.  If you can, wear a mask if you must go out of the house or if you are in close contact with someone who is not sick. Make sure your mask covers your nose and mouth. Keep your home clean  Disinfect objects and surfaces that are frequently touched  every day. This may include: ? Counters and tables. ? Doorknobs and light switches. ? Sinks and faucets. ? Electronics such as phones, remote controls, keyboards, computers, and tablets.  Wash dishes in hot, soapy water or use a dishwasher. Air-dry your dishes.  Wash laundry in hot water. Prevent infecting other household members  Let healthy household members care for children and pets, if possible. If you have to care for children or pets, wash your hands often and wear a mask.  Sleep in a different bedroom or bed, if possible.  Do not share personal items, such as razors, toothbrushes, deodorant, combs, brushes, towels, and washcloths. Where to find more information Centers for Disease Control and Prevention (CDC)  Information and news updates: CardRetirement.cz World Health Organization [facility])  Information and news updates: AffordableSalon.es  Coronavirus health topic: https://thompson-craig.com/  Questions and answers on COVID-19: kruiseway.com  Global tracker: who.sprinklr.com American Academy of Pediatrics (AAP)  Information for families: www.healthychildren.org/English/health-issues/conditions/chest-lungs/Pages/2019-Novel-Coronavirus.aspx The coronavirus situation is changing rapidly. Check your local health authority website or the CDC and [facility] websites for updates and news. When should I contact a health care provider?  Contact your health care provider if you have symptoms of an infection, such as fever or cough, and you: ? Have been near anyone who is known to have coronavirus disease. ? Have come into contact with a person who is suspected to have coronavirus disease. ? Have traveled to an area where there is an outbreak of COVID-19. When should I get emergency medical care?  Get help right away by calling your local emergency services (911 in the [place] if you  have: ? Trouble breathing. ? Pain or pressure in your chest. ? Confusion. ? Blue-tinged lips and fingernails. ? Difficulty waking from sleep. ? Symptoms that get worse. Let the emergency medical personnel know if you think you have coronavirus disease. Summary  A new respiratory virus is spreading from person to person and causing COVID-19 (coronavirus disease).  The virus that causes COVID-19 appears to spread easily. It spreads from one person to another through droplets from breathing, speaking, singing, coughing, or sneezing.  Older adults and those with chronic diseases are at higher risk of disease. If you are at higher risk for complications, take extra precautions.  There is currently no vaccine to prevent coronavirus disease. There are no medicines, such as antibiotics or antivirals, to treat the virus.  You can protect yourself and your family by washing your hands often, avoiding touching your face, and covering your coughs and sneezes. This information is not intended to replace advice given to you by your health care provider. Make sure you discuss any questions you have with your health care provider. Document Revised: 11/30/2018 Document Reviewed: 05/29/2018 Elsevier Patient Education  2020 Elsevier Inc.  COVID-19: Quarantine vs. Isolation QUARANTINE keeps someone who was in close contact with someone who has COVID-19 away from others.  If you had close contact with a person who has COVID-19  Stay home until 14 days after your last contact.  Check your temperature twice a day and watch for symptoms of COVID-19.  If possible, stay away from people who are at higher-risk for getting very sick from COVID-19. ISOLATION keeps someone who is sick or tested positive for COVID-19 without symptoms away from others, even in their own home. If you are sick and think or know you have COVID-19  Stay home until after ? At least 10 days since symptoms first appeared and ? At  least 24 hours with no fever without fever-reducing medication and ? Symptoms have improved If you tested positive for COVID-19 but do not have symptoms  Stay home until after ? 10 days have passed since your positive test If you live with others, stay in a specific "sick room" or area and away from other people or animals, including pets. Use a separate bathroom, if available. SouthAmericaFlowers.co.uk 09/03/2018 This information is not intended to replace advice given to you by your health care provider. Make sure you discuss any questions you have with your health care provider. Document Revised: 01/17/2019 Document Reviewed: 01/17/2019 Elsevier Patient Education  2020 [employer].  How to Safely Wear and Take Off a Mask Wear your face mask correctly  Wash your hands before putting on your mask  Put it over your nose and mouth and secure it under your chin  Try to fit it snugly against the sides of your face  Make sure you can breathe easily  Do not place a mask on a child younger than 2 Use the mask to help protect others  Wear a mask to help protect others in case you're infected but don't have symptoms  Keep the mask on your face the entire time you're in public  Don't put the mask around your neck or up on your forehead  Don't touch the mask, and, if you do, clean your hands Follow everyday health habits  Stay at least 6 feet away from others  Avoid contact with people who are sick  Wash your hands often, with soap and water, for at least 20 seconds each time  Use hand sanitizer if soap and water are not available Take off your mask carefully, when you're home  Untie the strings behind your head or stretch the ear loops  Handle only by the ear loops or ties  Fold outside corners together  Place covering in the washing machine  Wash your hands with soap and water Personal masks are not surgical masks or N-95 respirators, both of which should be saved for health  care workers and other medical first responders. For instructions on making a cloth face covering, see: SouthAmericaFlowers.co.uk 11/21/2018 This information is not intended to replace advice given to you by your health care provider. Make sure you discuss any questions you have with your health care provider. Document Revised: 01/17/2019 Document Reviewed: 01/17/2019 Elsevier Patient Education  2020 Elsevier Inc.     IMPORTANT INFORMATION: PAY CLOSE ATTENTION   PHYSICIAN DISCHARGE INSTRUCTIONS  Follow with Primary care provider  Donita Brooks, MD  and other consultants as instructed by your Hospitalist Physician  SEEK MEDICAL CARE OR RETURN TO EMERGENCY ROOM IF SYMPTOMS COME BACK, WORSEN OR NEW PROBLEM DEVELOPS   Please note: You were cared for by a hospitalist during your hospital stay. Every effort will be made to forward records to your primary care provider.  You  can request that your primary care provider send for your hospital records if they have not received them.  Once you are discharged, your primary care physician will handle any further medical issues. Please note that NO REFILLS for any discharge medications will be authorized once you are discharged, as it is imperative that you return to your primary care physician (or establish a relationship with a primary care physician if you do not have one) for your post hospital discharge needs so that they can reassess your need for medications and monitor your lab values.  Please get a complete blood count and chemistry panel checked by your Primary MD at your next visit, and again as instructed by your Primary MD.  Get Medicines reviewed and adjusted: Please take all your medications with you for your next visit with your Primary MD  Laboratory/radiological data: Please request your Primary MD to go over all hospital tests and procedure/radiological results at the follow up, please ask your primary care provider to get all Hospital  records sent to his/her office.  In some cases, they will be blood work, cultures and biopsy results pending at the time of your discharge. Please request that your primary care provider follow up on these results.  If you are diabetic, please bring your blood sugar readings with you to your follow up appointment with primary care.    Please call and make your follow up appointments as soon as possible.    Also Note the following: If you experience worsening of your admission symptoms, develop shortness of breath, life threatening emergency, suicidal or homicidal thoughts you must seek medical attention immediately by calling 911 or calling your MD immediately  if symptoms less severe.  You must read complete instructions/literature along with all the possible adverse reactions/side effects for all the Medicines you take and that have been prescribed to you. Take any new Medicines after you have completely understood and accpet all the possible adverse reactions/side effects.   Do not drive when taking Pain medications or sleeping medications (Benzodiazepines)  Do not take more than prescribed Pain, Sleep and Anxiety Medications. It is not advisable to combine anxiety,sleep and pain medications without talking with your primary care practitioner  Special Instructions: If you have smoked or chewed Tobacco  in the last 2 yrs please stop smoking, stop any regular Alcohol  and or any Recreational drug use.  Wear Seat belts while driving.  Do not drive if taking any narcotic, mind altering or controlled substances or recreational drugs or alcohol.

## 2019-10-02 NOTE — Progress Notes (Signed)
  October 02, 2019  Patient: Gregory Lyons  Date of Birth: 12-Dec-1975  Date of Visit: 10/01/2019    To Whom It May Concern:  Gregory Lyons was seen and treated at [facility] from 10/01/2019 until 10/02/2019. Gregory Lyons can return back to work on 10/12/19.   Sincerely,

## 2019-10-03 ENCOUNTER — Ambulatory Visit (HOSPITAL_COMMUNITY)
Admit: 2019-10-03 | Discharge: 2019-10-03 | Disposition: A | Discharge status: Home / Self Care | Payer: Federal, State, Local not specified - PPO | Attending: Pulmonary Disease | Admitting: Pulmonary Disease

## 2019-10-03 DIAGNOSIS — J1282 Pneumonia due to coronavirus disease 2019: Secondary | ICD-10-CM | POA: Diagnosis not present

## 2019-10-03 DIAGNOSIS — U071 COVID-19: Secondary | ICD-10-CM | POA: Diagnosis not present

## 2019-10-03 MED ORDER — DIPHENHYDRAMINE HCL 50 MG/ML IJ SOLN: 50.0000 mg | Freq: Once | INTRAMUSCULAR | Status: DC | PRN

## 2019-10-03 MED ORDER — SODIUM CHLORIDE 0.9 % IV SOLN
100.0000 mg | Freq: Once | INTRAVENOUS | Status: AC
  Administered 2019-10-03: 100 mg via INTRAVENOUS
  Filled 2019-10-03: qty 20

## 2019-10-03 MED ORDER — FAMOTIDINE IN NACL 20-0.9 MG/50ML-% IV SOLN: 20.0000 mg | Freq: Once | INTRAVENOUS | Status: DC | PRN

## 2019-10-03 MED ORDER — SODIUM CHLORIDE 0.9 % IV SOLN: INTRAVENOUS | Status: DC | PRN

## 2019-10-03 MED ORDER — METHYLPREDNISOLONE SODIUM SUCC 125 MG IJ SOLR: 125.0000 mg | Freq: Once | INTRAMUSCULAR | Status: DC | PRN

## 2019-10-03 MED ORDER — ALBUTEROL SULFATE HFA 108 (90 BASE) MCG/ACT IN AERS: 2.0000 | INHALATION_SPRAY | Freq: Once | RESPIRATORY_TRACT | Status: DC | PRN

## 2019-10-03 MED ORDER — EPINEPHRINE 0.3 MG/0.3ML IJ SOAJ: 0.3000 mg | Freq: Once | INTRAMUSCULAR | Status: DC | PRN

## 2019-10-03 NOTE — Progress Notes (Signed)
  Diagnosis: COVID-19  Physician:Dr wright  Procedure: [facility] Med: remdesivir infusion - Provided patient with remdesivir fact sheet for patients, parents and caregivers prior to infusion.  Complications: No immediate complications noted.  Discharge: Discharged home   Gregory Lyons 10/03/2019  

## 2019-10-03 NOTE — Discharge Instructions (Signed)

## 2019-10-04 ENCOUNTER — Ambulatory Visit (HOSPITAL_COMMUNITY)
Admit: 2019-10-04 | Discharge: 2019-10-04 | Disposition: A | Discharge status: Home / Self Care | Payer: Federal, State, Local not specified - PPO | Attending: Pulmonary Disease | Admitting: Pulmonary Disease

## 2019-10-04 DIAGNOSIS — U071 COVID-19: Secondary | ICD-10-CM | POA: Diagnosis not present

## 2019-10-04 DIAGNOSIS — J1282 Pneumonia due to coronavirus disease 2019: Secondary | ICD-10-CM | POA: Diagnosis not present

## 2019-10-04 MED ORDER — METHYLPREDNISOLONE SODIUM SUCC 125 MG IJ SOLR: 125.0000 mg | Freq: Once | INTRAMUSCULAR | Status: DC | PRN

## 2019-10-04 MED ORDER — DIPHENHYDRAMINE HCL 50 MG/ML IJ SOLN: 50.0000 mg | Freq: Once | INTRAMUSCULAR | Status: DC | PRN

## 2019-10-04 MED ORDER — EPINEPHRINE 0.3 MG/0.3ML IJ SOAJ: 0.3000 mg | Freq: Once | INTRAMUSCULAR | Status: DC | PRN

## 2019-10-04 MED ORDER — SODIUM CHLORIDE 0.9 % IV SOLN
100.0000 mg | Freq: Once | INTRAVENOUS | Status: AC
  Administered 2019-10-04: 100 mg via INTRAVENOUS
  Filled 2019-10-04: qty 20

## 2019-10-04 MED ORDER — SODIUM CHLORIDE 0.9 % IV SOLN: INTRAVENOUS | Status: DC | PRN

## 2019-10-04 MED ORDER — FAMOTIDINE IN NACL 20-0.9 MG/50ML-% IV SOLN: 20.0000 mg | Freq: Once | INTRAVENOUS | Status: DC | PRN

## 2019-10-04 MED ORDER — ALBUTEROL SULFATE HFA 108 (90 BASE) MCG/ACT IN AERS: 2.0000 | INHALATION_SPRAY | Freq: Once | RESPIRATORY_TRACT | Status: DC | PRN

## 2019-10-04 NOTE — Progress Notes (Signed)
  Diagnosis: COVID-19  Physician: Dr. Patrick Wright  Procedure: [facility] Infusion Clinic Med: remdesivir infusion - Provided patient with remdesivir fact sheet for patients, parents and caregivers prior to infusion.  Complications: No immediate complications noted.  Discharge: Discharged home   Gregory Lyons 10/04/2019   

## 2019-10-04 NOTE — Discharge Instructions (Signed)
10 Things You Can Do to Manage Your COVID-19 Symptoms at Home If you have possible or confirmed COVID-19: 1. Stay home from work and school. And stay away from other public places. If you must go out, avoid using any kind of public transportation, ridesharing, or taxis. 2. Monitor your symptoms carefully. If your symptoms get worse, call your healthcare provider immediately. 3. Get rest and stay hydrated. 4. If you have a medical appointment, call the healthcare provider ahead of time and tell them that you have or may have COVID-19. 5. For medical emergencies, call 911 and notify the dispatch personnel that you have or may have COVID-19. 6. Cover your cough and sneezes with a tissue or use the inside of your elbow. 7. Wash your hands often with soap and water for at least 20 seconds or clean your hands with an alcohol-based hand sanitizer that contains at least 60% alcohol. 8. As much as possible, stay in a specific room and away from other people in your home. Also, you should use a separate bathroom, if available. If you need to be around other people in or outside of the home, wear a mask. 9. Avoid sharing personal items with other people in your household, like dishes, towels, and bedding. 10. Clean all surfaces that are touched often, like counters, tabletops, and doorknobs. Use household cleaning sprays or wipes according to the label instructions. cdc.gov/coronavirus 08/15/2018 This information is not intended to replace advice given to you by your health care provider. Make sure you discuss any questions you have with your health care provider. Document Revised: 01/17/2019 Document Reviewed: 01/17/2019 Elsevier Patient Education  2020 Elsevier Inc.  

## 2019-10-05 ENCOUNTER — Ambulatory Visit (HOSPITAL_COMMUNITY)
Admit: 2019-10-05 | Discharge: 2019-10-05 | Disposition: A | Discharge status: Home / Self Care | Payer: Federal, State, Local not specified - PPO | Attending: Pulmonary Disease | Admitting: Pulmonary Disease

## 2019-10-05 DIAGNOSIS — U071 COVID-19: Secondary | ICD-10-CM | POA: Diagnosis not present

## 2019-10-05 MED ORDER — ALBUTEROL SULFATE HFA 108 (90 BASE) MCG/ACT IN AERS: 2.0000 | INHALATION_SPRAY | Freq: Once | RESPIRATORY_TRACT | Status: DC | PRN

## 2019-10-05 MED ORDER — SODIUM CHLORIDE 0.9 % IV SOLN: INTRAVENOUS | Status: DC | PRN

## 2019-10-05 MED ORDER — FAMOTIDINE IN NACL 20-0.9 MG/50ML-% IV SOLN: 20.0000 mg | Freq: Once | INTRAVENOUS | Status: DC | PRN

## 2019-10-05 MED ORDER — DIPHENHYDRAMINE HCL 50 MG/ML IJ SOLN: 50.0000 mg | Freq: Once | INTRAMUSCULAR | Status: DC | PRN

## 2019-10-05 MED ORDER — EPINEPHRINE 0.3 MG/0.3ML IJ SOAJ: 0.3000 mg | Freq: Once | INTRAMUSCULAR | Status: DC | PRN

## 2019-10-05 MED ORDER — METHYLPREDNISOLONE SODIUM SUCC 125 MG IJ SOLR: 125.0000 mg | Freq: Once | INTRAMUSCULAR | Status: DC | PRN

## 2019-10-05 MED ORDER — SODIUM CHLORIDE 0.9 % IV SOLN
100.0000 mg | Freq: Once | INTRAVENOUS | Status: AC
  Administered 2019-10-05: 100 mg via INTRAVENOUS
  Filled 2019-10-05: qty 20

## 2019-10-05 NOTE — Discharge Instructions (Signed)
10 Things You Can Do to Manage Your COVID-19 Symptoms at Home If you have possible or confirmed COVID-19: 1. Stay home from work and school. And stay away from other public places. If you must go out, avoid using any kind of public transportation, ridesharing, or taxis. 2. Monitor your symptoms carefully. If your symptoms get worse, call your healthcare provider immediately. 3. Get rest and stay hydrated. 4. If you have a medical appointment, call the healthcare provider ahead of time and tell them that you have or may have COVID-19. 5. For medical emergencies, call 911 and notify the dispatch personnel that you have or may have COVID-19. 6. Cover your cough and sneezes with a tissue or use the inside of your elbow. 7. Wash your hands often with soap and water for at least 20 seconds or clean your hands with an alcohol-based hand sanitizer that contains at least 60% alcohol. 8. As much as possible, stay in a specific room and away from other people in your home. Also, you should use a separate bathroom, if available. If you need to be around other people in or outside of the home, wear a mask. 9. Avoid sharing personal items with other people in your household, like dishes, towels, and bedding. 10. Clean all surfaces that are touched often, like counters, tabletops, and doorknobs. Use household cleaning sprays or wipes according to the label instructions. cdc.gov/coronavirus 08/15/2018 This information is not intended to replace advice given to you by your health care provider. Make sure you discuss any questions you have with your health care provider. Document Revised: 01/17/2019 Document Reviewed: 01/17/2019 Elsevier Patient Education  2020 Elsevier Inc.  

## 2019-10-05 NOTE — Progress Notes (Signed)
  Diagnosis: COVID-19  Physician: Dr Delford Field  Procedure: [facility]fusion [facility] Med: remdesivir infusion - Provided patient with remdesivir fact sheet for patients, parents and caregivers prior to infusion.  Complications: No immediate complications noted.  Discharge: Discharged home   Reginia Forts Albarece 10/05/2019

## 2019-10-06 LAB — CULTURE, BLOOD (ROUTINE X 2)
Culture: NO GROWTH
Culture: NO GROWTH
Special Requests: ADEQUATE

## 2019-10-10 ENCOUNTER — Other Ambulatory Visit: Payer: Self-pay

## 2019-10-10 ENCOUNTER — Ambulatory Visit (INDEPENDENT_AMBULATORY_CARE_PROVIDER_SITE_OTHER): Payer: Federal, State, Local not specified - PPO | Admitting: Family Medicine

## 2019-10-10 VITALS — BP 120/80 | HR 103 | Temp 98.4°F | Ht 72.0 in | Wt 262.0 lb

## 2019-10-10 DIAGNOSIS — U071 COVID-19: Secondary | ICD-10-CM

## 2019-10-10 NOTE — Progress Notes (Signed)
Subjective:    Patient ID: Gregory Lyons, male    DOB: [birth year], 44 y.o.   MRN: [id number]  HPI Patient was hospitalized recently with Covid pneumonia.  When I first saw the patient, the pneumonia was in the right lower lobe based on auscultation.  Based on imaging, he also had pneumonia in the left lower lobe.  He was hospitalized for approximately 2 days due to hypoxia.  He was then discharged home and completed outpatient remdesivir and steroid therapy.  He states that he is doing much better.  He has been home from the hospital now for 1 week.  He denies any chest pain.  He denies any shortness of breath.  He denies any pleurisy.  Lungs are clear to auscultation bilaterally.  He denies any leg swelling.  He denies any myalgias or meningismus.  Patient states that he would like to return to work.  Tentatively he is scheduled to go back to work on Monday the 30th. Past Medical History:  Diagnosis Date  . Complication of anesthesia    pt states gets mean   . GERD without esophagitis   . Sleep apnea    pt states does not use CPAP pt scored 5 on stop bang tool results sent to PCP    Past Surgical History:  Procedure Laterality Date  . ESOPHAGEAL DILATION N/A 05/19/2016   Procedure: ESOPHAGEAL DILATION;  Surgeon: West Bali, MD;  Location: [facility];  Service: Endoscopy;  Laterality: N/A;  . ESOPHAGOGASTRODUODENOSCOPY N/A 05/19/2016   Procedure: ESOPHAGOGASTRODUODENOSCOPY (EGD);  Surgeon: West Bali, MD;  Location: [facility];  Service: Endoscopy;  Laterality: N/A;  . KNEE ARTHROSCOPY Right 03/12/2014   Procedure: ARTHROSCOPY RIGHT KNEE WITH MENISCAL DEBRIDEMENT condroplasty;  Surgeon: Loanne Drilling, MD;  Location: [facility] OR[facility];  Service: Orthopedics;  Laterality: Right;  . right knee arthroscopy     12 to 13 years ago   . TENDON REPAIR     left hand 2007    Current Outpatient Medications on File Prior to Visit  Medication Sig Dispense Refill  . omeprazole (PRILOSEC) 40 MG  capsule TAKE 1 CAPSULE(40 MG) BY MOUTH TWICE DAILY (Patient taking differently: Take 40 mg by mouth 2 (two) times daily. ) 180 capsule 1  . chlorpheniramine-HYDROcodone (TUSSIONEX) 10-8 MG/5ML SUER Take 5 mLs by mouth every 12 (twelve) hours as needed for cough. 70 mL 0  . dexamethasone (DECADRON) 6 MG tablet Take 1 tablet (6 mg total) by mouth daily for 8 doses. 8 tablet 0  . ipratropium (ATROVENT) 0.06 % nasal spray Place 2 sprays into both nostrils 3 (three) times daily.    . Ipratropium-Albuterol (COMBIVENT) 20-100 MCG/ACT AERS respimat Inhale 1 puff into the lungs every 6 (six) hours as needed for wheezing or shortness of breath. (Patient not taking: Reported on 10/10/2019) 4 g 0  . [START ON 10/11/2019] meloxicam (MOBIC) 15 MG tablet Take 1 tablet (15 mg total) by mouth daily. (Patient not taking: Reported on 10/10/2019) 90 tablet 2  . ondansetron (ZOFRAN) 4 MG tablet Take 1 tablet (4 mg total) by mouth every 6 (six) hours as needed for nausea or vomiting. 12 tablet 0   No current facility-administered medications on file prior to visit.   No Known Allergies Social History   Socioeconomic History  . Marital status: Married    Spouse name: Not on file  . Number of children: Not on file  . Years of education: Not on file  . Highest  education level: Not on file  Occupational History  . Not on file  Tobacco Use  . Smoking status: Former Smoker    Packs/day: 1.00    Years: 30.00    Pack years: 30.00    Types: Cigarettes    Quit date: 02/15/2008    Years since quitting: 11.6  . Smokeless tobacco: Former Neurosurgeon    Types: Chew  Substance and Sexual Activity  . Alcohol use: Yes    Comment: occas beer   . Drug use: No  . Sexual activity: Not on file  Other Topics Concern  . Not on file  Social History Narrative  . Not on file   Social Determinants of Health   Financial Resource Strain:   . Difficulty of Paying Living Expenses: Not on file  Food Insecurity:   . Worried About Community education officer in the Last Year: Not on file  . Ran Out of Food in the Last Year: Not on file  Transportation Needs:   . Lack of Transportation (Medical): Not on file  . Lack of Transportation (Non-Medical): Not on file  Physical Activity:   . Days of Exercise per Week: Not on file  . Minutes of Exercise per Session: Not on file  Stress:   . Feeling of Stress : Not on file  Social Connections:   . Frequency of Communication with Friends and Family: Not on file  . Frequency of Social Gatherings with Friends and Family: Not on file  . Attends Religious Services: Not on file  . Active Member of Clubs or Organizations: Not on file  . Attends Banker Meetings: Not on file  . Marital Status: Not on file  Intimate Partner Violence:   . Fear of Current or Ex-Partner: Not on file  . Emotionally Abused: Not on file  . Physically Abused: Not on file  . Sexually Abused: Not on file   No family history on file.' Dad has atrial fibrillation.  There is also remote family history of diabetes   Review of Systems  All other systems reviewed and are negative.      Objective:   Physical Exam Vitals reviewed.  Constitutional:      General: He is not in acute distress.    Appearance: He is obese. He is not ill-appearing, toxic-appearing or diaphoretic.  HENT:     Head: Normocephalic and atraumatic.     Nose: No congestion or rhinorrhea.  Eyes:     General: No scleral icterus.       Right eye: No discharge.        Left eye: No discharge.     Extraocular Movements: Extraocular movements intact.     Conjunctiva/sclera: Conjunctivae normal.     Pupils: Pupils are equal, round, and reactive to light.  Neck:     Vascular: No carotid bruit.  Cardiovascular:     Rate and Rhythm: Regular rhythm. Tachycardia present.     Pulses: Normal pulses.     Heart sounds: Normal heart sounds. No murmur heard.  No friction rub. No gallop.   Pulmonary:     Effort: Pulmonary effort is normal. No  respiratory distress.     Breath sounds: No stridor. No wheezing, rhonchi or rales.  Chest:     Chest wall: No tenderness.  Abdominal:     General: Bowel sounds are normal. There is no distension.     Palpations: Abdomen is soft.     Tenderness: There is no abdominal tenderness.  Musculoskeletal:     Cervical back: Normal range of motion and neck supple. No muscular tenderness.     Right lower leg: No edema.     Left lower leg: No edema.  Lymphadenopathy:     Cervical: No cervical adenopathy.  Neurological:     Mental Status: He is alert.           Assessment & Plan:  COVID-19 virus infection  Patient's Covid infection has clinically resolved.  I do not feel that he is contagious any longer.  Patient is cleared to return to work on Monday, August 30.  Follow-up as needed.

## 2019-11-07 ENCOUNTER — Other Ambulatory Visit: Payer: Federal, State, Local not specified - PPO

## 2019-11-07 ENCOUNTER — Other Ambulatory Visit: Payer: Self-pay

## 2019-11-07 DIAGNOSIS — Z0000 Encounter for general adult medical examination without abnormal findings: Secondary | ICD-10-CM

## 2019-11-08 ENCOUNTER — Other Ambulatory Visit: Payer: Self-pay

## 2019-11-08 ENCOUNTER — Other Ambulatory Visit: Payer: Federal, State, Local not specified - PPO

## 2019-11-08 DIAGNOSIS — Z0000 Encounter for general adult medical examination without abnormal findings: Secondary | ICD-10-CM

## 2019-11-09 LAB — COMPLETE METABOLIC PANEL WITH GFR
AG Ratio: 1.8 (calc) (ref 1.0–2.5)
ALT: 50 U/L — ABNORMAL HIGH (ref 9–46)
AST: 27 U/L (ref 10–40)
Albumin: 4.5 g/dL (ref 3.6–5.1)
Alkaline phosphatase (APISO): 51 U/L (ref 36–130)
BUN: 17 mg/dL (ref 7–25)
CO2: 27 mmol/L (ref 20–32)
Calcium: 9.5 mg/dL (ref 8.6–10.3)
Chloride: 103 mmol/L (ref 98–110)
Creat: 1 mg/dL (ref 0.60–1.35)
GFR, Est African American: 104 mL/min/{1.73_m2} (ref >=60)
GFR, Est Non African American: 90 mL/min/{1.73_m2} (ref >=60)
Globulin: 2.5 g/dL (calc) (ref 1.9–3.7)
Glucose, Bld: 95 mg/dL (ref 65–99)
Potassium: 4.6 mmol/L (ref 3.5–5.3)
Sodium: 140 mmol/L (ref 135–146)
Total Bilirubin: 0.6 mg/dL (ref 0.2–1.2)
Total Protein: 7 g/dL (ref 6.1–8.1)

## 2019-11-09 LAB — LIPID PANEL
Cholesterol: 205 mg/dL — ABNORMAL HIGH (ref <200)
HDL: 51 mg/dL (ref >=40)
LDL Cholesterol (Calc): 131 mg/dL (calc) — ABNORMAL HIGH
Non-HDL Cholesterol (Calc): 154 mg/dL (calc) — ABNORMAL HIGH (ref <130)
Total CHOL/HDL Ratio: 4 (calc) (ref <5.0)
Triglycerides: 121 mg/dL (ref <150)

## 2019-11-09 LAB — CBC WITH DIFFERENTIAL/PLATELET
Absolute Monocytes: 702 cells/uL (ref 200–950)
Basophils Absolute: 33 cells/uL (ref 0–200)
Basophils Relative: 0.5 %
Eosinophils Absolute: 163 cells/uL (ref 15–500)
Eosinophils Relative: 2.5 %
HCT: 44.1 % (ref 38.5–50.0)
Hemoglobin: 14.8 g/dL (ref 13.2–17.1)
Lymphs Abs: 1762 cells/uL (ref 850–3900)
MCH: 30.9 pg (ref 27.0–33.0)
MCHC: 33.6 g/dL (ref 32.0–36.0)
MCV: 92.1 fL (ref 80.0–100.0)
MPV: 11.3 fL (ref 7.5–12.5)
Monocytes Relative: 10.8 %
Neutro Abs: 3842 cells/uL (ref 1500–7800)
Neutrophils Relative %: 59.1 %
Platelets: 239 10*3/uL (ref 140–400)
RBC: 4.79 10*6/uL (ref 4.20–5.80)
RDW: 13 % (ref 11.0–15.0)
Total Lymphocyte: 27.1 %
WBC: 6.5 10*3/uL (ref 3.8–10.8)

## 2019-11-22 ENCOUNTER — Other Ambulatory Visit: Payer: Self-pay

## 2019-11-22 ENCOUNTER — Ambulatory Visit (INDEPENDENT_AMBULATORY_CARE_PROVIDER_SITE_OTHER): Payer: Federal, State, Local not specified - PPO | Admitting: Family Medicine

## 2019-11-22 VITALS — BP 110/80 | HR 72 | Temp 98.1°F | Ht 72.0 in | Wt 276.0 lb

## 2019-11-22 DIAGNOSIS — Z0001 Encounter for general adult medical examination with abnormal findings: Secondary | ICD-10-CM

## 2019-11-22 DIAGNOSIS — E7800 Pure hypercholesterolemia, unspecified: Secondary | ICD-10-CM

## 2019-11-22 DIAGNOSIS — Z0000 Encounter for general adult medical examination without abnormal findings: Secondary | ICD-10-CM

## 2019-11-22 NOTE — Progress Notes (Signed)
Subjective:    Patient ID: Gregory Lyons, male    DOB: 20-Nov-1975, 44 y.o.   MRN: [id number]  HPI Patient presents today for complete physical exam.  Patient states that he feels almost back to normal and has almost recovered completely from Covid.  He is working full-time with no restrictions.  He does plan on getting the Covid vaccine after 90 days is complete.  He is due for a flu shot but he declines this.  He is now due for colon cancer screening.  We discussed a colonoscopy versus Cologuard.  He declines both at the present time.  He believes he had a tetanus shot within the last 10 years.  Otherwise he is doing well with no concerns.  I calculated his 10-year risk of cardiovascular disease to be less than 5% despite his cholesterol.  He is no longer using oral tobacco and has been abstinent for this for 2 years. Appointment on 11/08/2019  Component Date Value Ref Range Status  . Cholesterol 11/08/2019 205* <200 mg/dL Final  . HDL 93/23/5573 51  > OR = 40 mg/dL Final  . Triglycerides 11/08/2019 121  <150 mg/dL Final  . LDL Cholesterol (Calc) 11/08/2019 131* mg/dL (calc) Final   Comment: Reference range: <100 . Desirable range <100 mg/dL for primary prevention;   <70 mg/dL for patients with CHD or diabetic patients  with > or = 2 CHD risk factors. Marland Kitchen LDL-C is now calculated using the Martin-Hopkins  calculation, which is a validated novel method providing  better accuracy than the Friedewald equation in the  estimation of LDL-C.  Horald Pollen et al. Lenox Ahr. 2202;542(70): 2061-2068  (http://education.[facility].com/faq/FAQ164)   . Total CHOL/HDL Ratio 11/08/2019 4.0  <6.2 (calc) Final  . Non-HDL Cholesterol (Calc) 11/08/2019 154* <130 mg/dL (calc) Final   Comment: For patients with diabetes plus 1 major ASCVD risk  factor, treating to a non-HDL-C goal of <100 mg/dL  (LDL-C of <37 mg/dL) is considered a therapeutic  option.   . Glucose, Bld 11/08/2019 95  65 - 99 mg/dL Final    Comment: .            Fasting reference interval .   . BUN 11/08/2019 17  7 - 25 mg/dL Final  . Creat 62/83/1517 1.00  0.60 - 1.35 mg/dL Final  . GFR, Est Non African American 11/08/2019 90  > OR = 60 mL/min/1.91m2 Final  . GFR, Est African American 11/08/2019 104  > OR = 60 mL/min/1.43m2 Final  . BUN/Creatinine Ratio 11/08/2019 NOT APPLICABLE  6 - 22 (calc) Final  . Sodium 11/08/2019 140  135 - 146 mmol/L Final  . Potassium 11/08/2019 4.6  3.5 - 5.3 mmol/L Final  . Chloride 11/08/2019 103  98 - 110 mmol/L Final  . CO2 11/08/2019 27  20 - 32 mmol/L Final  . Calcium 11/08/2019 9.5  8.6 - 10.3 mg/dL Final  . Total Protein 11/08/2019 7.0  6.1 - 8.1 g/dL Final  . Albumin 61/60/7371 4.5  3.6 - 5.1 g/dL Final  . Globulin 08/10/9483 2.5  1.9 - 3.7 g/dL (calc) Final  . AG Ratio 11/08/2019 1.8  1.0 - 2.5 (calc) Final  . Total Bilirubin 11/08/2019 0.6  0.2 - 1.2 mg/dL Final  . Alkaline phosphatase (APISO) 11/08/2019 51  36 - 130 U/L Final  . AST 11/08/2019 27  10 - 40 U/L Final  . ALT 11/08/2019 50* 9 - 46 U/L Final  . WBC 11/08/2019 6.5  3.8 - 10.8 Thousand/uL Final  .  RBC 11/08/2019 4.79  4.20 - 5.80 Million/uL Final  . Hemoglobin 11/08/2019 14.8  13.2 - 17.1 g/dL Final  . HCT 16/10/960409/24/2021 44.1  38 - 50 % Final  . MCV 11/08/2019 92.1  80.0 - 100.0 fL Final  . MCH 11/08/2019 30.9  27.0 - 33.0 pg Final  . MCHC 11/08/2019 33.6  32.0 - 36.0 g/dL Final  . RDW 54/09/811909/24/2021 13.0  11.0 - 15.0 % Final  . Platelets 11/08/2019 239  140 - 400 Thousand/uL Final  . MPV 11/08/2019 11.3  7.5 - 12.5 fL Final  . Neutro Abs 11/08/2019 3,842  1,500 - 7,800 cells/uL Final  . Lymphs Abs 11/08/2019 1,762  850 - 3,900 cells/uL Final  . Absolute Monocytes 11/08/2019 702  200 - 950 cells/uL Final  . Eosinophils Absolute 11/08/2019 163  15 - 500 cells/uL Final  . Basophils Absolute 11/08/2019 33  0 - 200 cells/uL Final  . Neutrophils Relative % 11/08/2019 59.1  % Final  . Total Lymphocyte 11/08/2019 27.1  % Final   . Monocytes Relative 11/08/2019 10.8  % Final  . Eosinophils Relative 11/08/2019 2.5  % Final  . Basophils Relative 11/08/2019 0.5  % Final   Past Medical History:  Diagnosis Date  . Complication of anesthesia    pt states gets mean   . GERD without esophagitis   . Sleep apnea    pt states does not use CPAP pt scored 5 on stop bang tool results sent to PCP    Past Surgical History:  Procedure Laterality Date  . ESOPHAGEAL DILATION N/A 05/19/2016   Procedure: ESOPHAGEAL DILATION;  Surgeon: West BaliSandi L Fields, MD;  [facility]: [facility];  Service: Endoscopy;  Laterality: N/A;  . ESOPHAGOGASTRODUODENOSCOPY N/A 05/19/2016   Procedure: ESOPHAGOGASTRODUODENOSCOPY (EGD);  Surgeon: West BaliSandi L Fields, MD;  Location: [facility];  Service: Endoscopy;  Laterality: N/A;  . KNEE ARTHROSCOPY Right 03/12/2014   Procedure: ARTHROSCOPY RIGHT KNEE WITH MENISCAL DEBRIDEMENT condroplasty;  Surgeon: Loanne DrillingFrank Aluisio V, MD;  Location: [facility];  Service: Orthopedics;  Laterality: Right;  . right knee arthroscopy     12 to 13 years ago   . TENDON REPAIR     left hand 2007    Current Outpatient Medications on File Prior to Visit  Medication Sig Dispense Refill  . meloxicam (MOBIC) 15 MG tablet Take 1 tablet (15 mg total) by mouth daily. 90 tablet 2  . omeprazole (PRILOSEC) 40 MG capsule TAKE 1 CAPSULE(40 MG) BY MOUTH TWICE DAILY (Patient taking differently: Take 40 mg by mouth 2 (two) times daily. ) 180 capsule 1  . chlorpheniramine-HYDROcodone (TUSSIONEX) 10-8 MG/5ML SUER Take 5 mLs by mouth every 12 (twelve) hours as needed for cough. 70 mL 0  . ipratropium (ATROVENT) 0.06 % nasal spray Place 2 sprays into both nostrils 3 (three) times daily.    . Ipratropium-Albuterol (COMBIVENT) 20-100 MCG/ACT AERS respimat Inhale 1 puff into the lungs every 6 (six) hours as needed for wheezing or shortness of breath. (Patient not taking: Reported on 10/10/2019) 4 g 0  . ondansetron (ZOFRAN) 4 MG tablet Take 1 tablet (4 mg total) by  mouth every 6 (six) hours as needed for nausea or vomiting. 12 tablet 0   No current facility-administered medications on file prior to visit.   No Known Allergies Social History   Socioeconomic History  . Marital status: Married    Spouse name: Not on file  . Number of children: Not on file  . Years of education: Not  on file  . Highest education level: Not on file  Occupational History  . Not on file  Tobacco Use  . Smoking status: Former Smoker    Packs/day: 1.00    Years: 30.00    Pack years: 30.00    Types: Cigarettes    Quit date: 02/15/2008    Years since quitting: 11.7  . Smokeless tobacco: Former Neurosurgeon    Types: Chew  Substance and Sexual Activity  . Alcohol use: Yes    Comment: occas beer   . Drug use: No  . Sexual activity: Not on file  Other Topics Concern  . Not on file  Social History Narrative  . Not on file   Social Determinants of Health   Financial Resource Strain:   . Difficulty of Paying Living Expenses: Not on file  Food Insecurity:   . Worried About Programme researcher, broadcasting/film/video in the Last Year: Not on file  . Ran Out of Food in the Last Year: Not on file  Transportation Needs:   . Lack of Transportation (Medical): Not on file  . Lack of Transportation (Non-Medical): Not on file  Physical Activity:   . Days of Exercise per Week: Not on file  . Minutes of Exercise per Session: Not on file  Stress:   . Feeling of Stress : Not on file  Social Connections:   . Frequency of Communication with Friends and Family: Not on file  . Frequency of Social Gatherings with Friends and Family: Not on file  . Attends Religious Services: Not on file  . Active Member of Clubs or Organizations: Not on file  . Attends Banker Meetings: Not on file  . Marital Status: Not on file  Intimate Partner Violence:   . Fear of Current or Ex-Partner: Not on file  . Emotionally Abused: Not on file  . Physically Abused: Not on file  . Sexually Abused: Not on file    No family history on file.' Dad has atrial fibrillation.  There is also remote family history of diabetes   Review of Systems  All other systems reviewed and are negative.      Objective:   Physical Exam Vitals reviewed.  Constitutional:      General: He is not in acute distress.    Appearance: He is obese. He is not ill-appearing, toxic-appearing or diaphoretic.  HENT:     Head: Normocephalic and atraumatic.     Right Ear: Tympanic membrane and ear canal normal. There is no impacted cerumen.     Left Ear: Tympanic membrane and ear canal normal. There is no impacted cerumen.     Nose: Nose normal. No congestion or rhinorrhea.     Mouth/Throat:     Mouth: Mucous membranes are moist.     Pharynx: Oropharynx is clear. No oropharyngeal exudate or posterior oropharyngeal erythema.  Eyes:     General: No scleral icterus.       Right eye: No discharge.        Left eye: No discharge.     Extraocular Movements: Extraocular movements intact.     Conjunctiva/sclera: Conjunctivae normal.     Pupils: Pupils are equal, round, and reactive to light.  Neck:     Vascular: No carotid bruit.  Cardiovascular:     Rate and Rhythm: Normal rate and regular rhythm.     Pulses: Normal pulses.     Heart sounds: Normal heart sounds. No murmur heard.  No friction rub. No gallop.  Pulmonary:     Effort: Pulmonary effort is normal. No respiratory distress.     Breath sounds: Normal breath sounds. No stridor. No wheezing, rhonchi or rales.  Chest:     Chest wall: No tenderness.  Abdominal:     General: Abdomen is flat. Bowel sounds are normal. There is no distension.     Palpations: Abdomen is soft. There is no mass.     Tenderness: There is no abdominal tenderness. There is no right CVA tenderness, left CVA tenderness, guarding or rebound.     Hernia: No hernia is present.  Musculoskeletal:        General: No deformity.     Cervical back: Normal range of motion and neck supple. No rigidity.  No muscular tenderness.     Right lower leg: No edema.     Left lower leg: No edema.  Lymphadenopathy:     Cervical: No cervical adenopathy.  Skin:    General: Skin is warm.     Coloration: Skin is not jaundiced.     Findings: No bruising, erythema, lesion or rash.  Neurological:     General: No focal deficit present.     Mental Status: He is alert and oriented to person, place, and time. Mental status is at baseline.     Cranial Nerves: No cranial nerve deficit.     Sensory: No sensory deficit.     Motor: No weakness.     Coordination: Coordination normal.     Gait: Gait normal.  Psychiatric:        Mood and Affect: Mood normal.        Behavior: Behavior normal.        Thought Content: Thought content normal.        Judgment: Judgment normal.           Assessment & Plan:  General medical exam  Pure hypercholesterolemia  Recommended a flu shot.  Patient politely declined.  Recommended the Covid booster 90 days after his infection which she agrees to.  Offered the patient a colonoscopy versus Cologuard.  He declines both at the present time.  Discussed strategies to lower cholesterol.  Recommended the patient try to lose 15 to 20 pounds over the next 6 months.  Ideally I like to see him around 230 pounds.  I believe if he does that, his liver function test will normalize and his cholesterol will be much better.  Congratulated the patient on staying away from smokeless tobacco.

## 2020-09-10 ENCOUNTER — Other Ambulatory Visit: Payer: Self-pay | Admitting: Family Medicine

## 2020-09-24 ENCOUNTER — Encounter: Payer: Self-pay | Admitting: Family Medicine

## 2020-09-24 ENCOUNTER — Ambulatory Visit (INDEPENDENT_AMBULATORY_CARE_PROVIDER_SITE_OTHER): Payer: Federal, State, Local not specified - PPO | Admitting: Family Medicine

## 2020-09-24 ENCOUNTER — Other Ambulatory Visit: Payer: Self-pay

## 2020-09-24 VITALS — BP 134/88 | HR 84 | Temp 98.0°F | Resp 18 | Ht 72.0 in | Wt 288.0 lb

## 2020-09-24 DIAGNOSIS — M25561 Pain in right knee: Secondary | ICD-10-CM | POA: Diagnosis not present

## 2020-09-24 NOTE — Progress Notes (Signed)
Subjective:    Patient ID: Gregory Lyons, male    DOB: 1975-12-06, 45 y.o.   MRN: [id number]  HPI Patient recently went on a golf trip to [facility].  After playing several rounds of golf he developed pain in his right knee.  The pain is located primarily over the medial joint line.  He denies any posterior pain or stiffness.  However it hurts over the medial joint line when he is walking.  He is having to bear weight awkwardly on his leg to take the pain off the anterior part of his knee.  There is no erythema or effusion.  There is no laxity to varus or valgus stress.  He denies feeling of instability.  He has known osteoarthritis in the joint.  He is requesting a cortisone shot today Past Medical History:  Diagnosis Date   Complication of anesthesia    pt states gets mean    GERD without esophagitis    Sleep apnea    pt states does not use CPAP pt scored 5 on stop bang tool results sent to PCP    Past Surgical History:  Procedure Laterality Date   ESOPHAGEAL DILATION N/A 05/19/2016   Procedure: ESOPHAGEAL DILATION;  Surgeon: West Bali, MD;  Location: [facility];  Service: Endoscopy;  Laterality: N/A;   ESOPHAGOGASTRODUODENOSCOPY N/A 05/19/2016   Procedure: ESOPHAGOGASTRODUODENOSCOPY (EGD);  Surgeon: West Bali, MD;  [facility]: [facility];  Service: Endoscopy;  Laterality: N/A;   KNEE ARTHROSCOPY Right 03/12/2014   Procedure: ARTHROSCOPY RIGHT KNEE WITH MENISCAL DEBRIDEMENT condroplasty;  Surgeon: Loanne Drilling, MD;  Location: [facility];  Service: Orthopedics;  Laterality: Right;   right knee arthroscopy     12 to 13 years ago    TENDON REPAIR     left hand 2007    Current Outpatient Medications on File Prior to Visit  Medication Sig Dispense Refill   Ipratropium-Albuterol (COMBIVENT) 20-100 MCG/ACT AERS respimat Inhale 1 puff into the lungs every 6 (six) hours as needed for wheezing or shortness of breath. 4 g 0   omeprazole (PRILOSEC) 40 MG capsule TAKE 1 CAPSULE(40  MG) BY MOUTH TWICE DAILY 180 capsule 1   meloxicam (MOBIC) 15 MG tablet Take 1 tablet (15 mg total) by mouth daily. (Patient not taking: Reported on 09/24/2020) 90 tablet 2   No current facility-administered medications on file prior to visit.   No Known Allergies Social History   Socioeconomic History   Marital status: Married    Spouse name: Not on file   Number of children: Not on file   Years of education: Not on file   Highest education level: Not on file  Occupational History   Not on file  Tobacco Use   Smoking status: Former    Packs/day: 1.00    Years: 30.00    Pack years: 30.00    Types: Cigarettes    Quit date: 02/15/2008    Years since quitting: 12.6   Smokeless tobacco: Former    Types: Chew  Substance and Sexual Activity   Alcohol use: Yes    Comment: occas beer    Drug use: No   Sexual activity: Not on file  Other Topics Concern   Not on file  Social History Narrative   Not on file   Social Determinants of Health   Financial Resource Strain: Not on file  Food Insecurity: Not on file  Transportation Needs: Not on file  Physical Activity: Not on file  Stress: Not on file  Social Connections: Not on file  Intimate Partner Violence: Not on file     Review of Systems  All other systems reviewed and are negative.     Objective:   Physical Exam Vitals reviewed.  Constitutional:      Appearance: He is obese.  Cardiovascular:     Rate and Rhythm: Normal rate and regular rhythm.     Heart sounds: Normal heart sounds.  Pulmonary:     Effort: Pulmonary effort is normal.     Breath sounds: Normal breath sounds.  Musculoskeletal:     Right knee: Decreased range of motion. Tenderness present over the medial joint line.  Neurological:     Mental Status: He is alert.          A/P Acute pain of right knee I suspect osteoarthritis in the knee exacerbated by golf recently.  Using sterile technique, I injected the right knee with 2 cc lidocaine, 2 cc  of Marcaine, and 2 cc of 40 mg/mL Kenalog.  The patient tolerated the procedure well without complication.  Recommended weight loss.  Patient will be a good candidate for Saxenda or Ozempic.  Patient will inquire with his insurance to see if they will cover these medications.

## 2020-12-25 ENCOUNTER — Ambulatory Visit: Payer: Federal, State, Local not specified - PPO | Admitting: Family Medicine

## 2020-12-25 ENCOUNTER — Encounter: Payer: Self-pay | Admitting: Family Medicine

## 2020-12-25 ENCOUNTER — Other Ambulatory Visit: Payer: Self-pay

## 2020-12-25 ENCOUNTER — Ambulatory Visit (HOSPITAL_COMMUNITY)
Admission: RE | Admit: 2020-12-25 | Discharge: 2020-12-25 | Disposition: A | Discharge status: Home / Self Care | Payer: Federal, State, Local not specified - PPO | Source: Ambulatory Visit | Attending: Family Medicine | Admitting: Family Medicine

## 2020-12-25 VITALS — BP 132/86 | HR 84 | Temp 98.4°F | Resp 18 | Ht 72.0 in | Wt 283.0 lb

## 2020-12-25 DIAGNOSIS — M25561 Pain in right knee: Secondary | ICD-10-CM

## 2020-12-25 DIAGNOSIS — M7989 Other specified soft tissue disorders: Secondary | ICD-10-CM | POA: Diagnosis not present

## 2020-12-25 NOTE — Progress Notes (Signed)
Subjective:    Patient ID: Gregory Lyons, male    DOB: 1975/03/06, 45 y.o.   MRN: [id number]  HPI Had cortisone shot in right knee in August.  Patient again is reporting pain in his right knee.  Majority the pain is located over the medial compartment.  However just proximal to the knee joint over the lateral compartment, and the distal part of the quadriceps he reports muscle spasms and muscle tightness.  He states that the distal lateral portion of the quadriceps aches and throbs and burns.  He believes that he is aggravating the inner portion of the knee joint favoring his leg walking to try to take the weight off the sore quadricep muscle.  He states that it has been sore for the last 6 years ever since he had arthroscopic surgery Past Medical History:  Diagnosis Date   Complication of anesthesia    pt states gets mean    GERD without esophagitis    Sleep apnea    pt states does not use CPAP pt scored 5 on stop bang tool results sent to PCP    Past Surgical History:  Procedure Laterality Date   ESOPHAGEAL DILATION N/A 05/19/2016   Procedure: ESOPHAGEAL DILATION;  Surgeon: West Bali, MD;  [facility]: [facility];  Service: Endoscopy;  Laterality: N/A;   ESOPHAGOGASTRODUODENOSCOPY N/A 05/19/2016   Procedure: ESOPHAGOGASTRODUODENOSCOPY (EGD);  Surgeon: West Bali, MD;  [facility];  Service: Endoscopy;  Laterality: N/A;   KNEE ARTHROSCOPY Right 03/12/2014   Procedure: ARTHROSCOPY RIGHT KNEE WITH MENISCAL DEBRIDEMENT condroplasty;  Surgeon: Loanne Drilling, MD;  Location: [facility];  Service: Orthopedics;  Laterality: Right;   right knee arthroscopy     12 to 13 years ago    TENDON REPAIR     left hand 2007    Current Outpatient Medications on File Prior to Visit  Medication Sig Dispense Refill   Ipratropium-Albuterol (COMBIVENT) 20-100 MCG/ACT AERS respimat Inhale 1 puff into the lungs every 6 (six) hours as needed for wheezing or shortness of breath. 4 g 0    meloxicam (MOBIC) 15 MG tablet Take 1 tablet (15 mg total) by mouth daily. (Patient not taking: Reported on 09/24/2020) 90 tablet 2   omeprazole (PRILOSEC) 40 MG capsule TAKE 1 CAPSULE(40 MG) BY MOUTH TWICE DAILY 180 capsule 1   No current facility-administered medications on file prior to visit.   No Known Allergies Social History   Socioeconomic History   Marital status: Married    Spouse name: Not on file   Number of children: Not on file   Years of education: Not on file   Highest education level: Not on file  Occupational History   Not on file  Tobacco Use   Smoking status: Former    Packs/day: 1.00    Years: 30.00    Pack years: 30.00    Types: Cigarettes    Quit date: 02/15/2008    Years since quitting: 12.8   Smokeless tobacco: Former    Types: Chew  Substance and Sexual Activity   Alcohol use: Yes    Comment: occas beer    Drug use: No   Sexual activity: Not on file  Other Topics Concern   Not on file  Social History Narrative   Not on file   Social Determinants of Health   Financial Resource Strain: Not on file  Food Insecurity: Not on file  Transportation Needs: Not on file  Physical Activity: Not on  file  Stress: Not on file  Social Connections: Not on file  Intimate Partner Violence: Not on file     Review of Systems  All other systems reviewed and are negative.     Objective:   Physical Exam Vitals reviewed.  Constitutional:      Appearance: He is obese.  Cardiovascular:     Rate and Rhythm: Normal rate and regular rhythm.     Heart sounds: Normal heart sounds.  Pulmonary:     Effort: Pulmonary effort is normal.     Breath sounds: Normal breath sounds.  Musculoskeletal:     Right knee: Decreased range of motion. Tenderness present over the medial joint line.  Neurological:     Mental Status: He is alert.     Acute pain of right knee - Plan: DG Knee Complete 4 Views Right I have recommended an x-ray of the right knee to evaluate the  severity of his osteoarthritis.  The pain over the distal portion of the lateral quadriceps I believe could be a potential partial tear.  The neck step would be an MRI to determine how severe.  Another option would be physical therapy.  Therefore I recommended that we try the cortisone shot today to see if we can calm down the pain in the knee.  If x-ray confirms arthritis and the pain goes away for 6 months or more, I would not do anything else.  If the x-ray confirms arthritis and the shot is only minimally effective, he may benefit from viscosupplementation through orthopedics.  If the pain over the lateral portion of the distal quadriceps persist, I would next recommend an MRI of the knee to determine if there is a tear in the distal quadriceps that would benefit from surgical correction versus physical therapy    Using sterile technique, I injected the right knee with 2 cc lidocaine, 2 cc of Marcaine, and 2 cc of 40 mg/mL Kenalog.  The patient tolerated the procedure well without complication.  Recommended weight loss.

## 2020-12-29 ENCOUNTER — Other Ambulatory Visit: Payer: Self-pay | Admitting: *Deleted

## 2020-12-29 DIAGNOSIS — M25561 Pain in right knee: Secondary | ICD-10-CM

## 2020-12-29 DIAGNOSIS — S7010XA Contusion of unspecified thigh, initial encounter: Secondary | ICD-10-CM

## 2021-01-23 ENCOUNTER — Other Ambulatory Visit: Payer: Self-pay

## 2021-01-23 ENCOUNTER — Ambulatory Visit
Admission: RE | Admit: 2021-01-23 | Discharge: 2021-01-23 | Disposition: A | Discharge status: Home / Self Care | Payer: Federal, State, Local not specified - PPO | Source: Ambulatory Visit | Attending: Family Medicine | Admitting: Family Medicine

## 2021-01-23 DIAGNOSIS — M25461 Effusion, right knee: Secondary | ICD-10-CM | POA: Diagnosis not present

## 2021-01-23 DIAGNOSIS — S7010XA Contusion of unspecified thigh, initial encounter: Secondary | ICD-10-CM

## 2021-01-23 DIAGNOSIS — M25561 Pain in right knee: Secondary | ICD-10-CM

## 2021-01-23 DIAGNOSIS — IMO0001 Reserved for inherently not codable concepts without codable children: Secondary | ICD-10-CM

## 2021-01-23 DIAGNOSIS — S83241A Other tear of medial meniscus, current injury, right knee, initial encounter: Secondary | ICD-10-CM | POA: Diagnosis not present

## 2021-01-23 MED ORDER — GADOBENATE DIMEGLUMINE 529 MG/ML IV SOLN
20.0000 mL | Freq: Once | INTRAVENOUS | Status: AC | PRN
  Administered 2021-01-23: 20 mL via INTRAVENOUS

## 2021-01-27 ENCOUNTER — Encounter: Payer: Self-pay | Admitting: Family Medicine

## 2021-01-27 DIAGNOSIS — S83241A Other tear of medial meniscus, current injury, right knee, initial encounter: Secondary | ICD-10-CM

## 2021-01-27 DIAGNOSIS — M25561 Pain in right knee: Secondary | ICD-10-CM

## 2021-02-01 ENCOUNTER — Other Ambulatory Visit: Payer: Self-pay | Admitting: Family Medicine

## 2021-02-01 ENCOUNTER — Telehealth: Payer: Self-pay

## 2021-02-01 MED ORDER — AZITHROMYCIN 250 MG PO TABS: ORAL_TABLET | ORAL | 0 refills | Status: DC

## 2021-02-01 NOTE — Telephone Encounter (Signed)
Pt called to report sinus congestion for several days, green mucus. Pt denies fevers, chills, cough or other s/s. Pt states it is his "usual yearly sinus infection" and is asking for a z-pak.  Please advise, thanks!

## 2021-02-01 NOTE — Telephone Encounter (Signed)
Spoke with pt and advised. Also advised to contact us if not improving after z-pak. Nothing further needed at this time.

## 2021-02-15 ENCOUNTER — Encounter: Payer: Self-pay | Admitting: Family Medicine

## 2021-03-12 DIAGNOSIS — M25561 Pain in right knee: Secondary | ICD-10-CM | POA: Diagnosis not present

## 2021-03-12 DIAGNOSIS — M1712 Unilateral primary osteoarthritis, left knee: Secondary | ICD-10-CM | POA: Diagnosis not present

## 2021-03-12 DIAGNOSIS — M170 Bilateral primary osteoarthritis of knee: Secondary | ICD-10-CM | POA: Diagnosis not present

## 2021-03-12 DIAGNOSIS — M1711 Unilateral primary osteoarthritis, right knee: Secondary | ICD-10-CM | POA: Diagnosis not present

## 2021-06-15 DIAGNOSIS — M1711 Unilateral primary osteoarthritis, right knee: Secondary | ICD-10-CM | POA: Diagnosis not present

## 2021-06-22 DIAGNOSIS — M1711 Unilateral primary osteoarthritis, right knee: Secondary | ICD-10-CM | POA: Diagnosis not present

## 2021-06-29 DIAGNOSIS — M1711 Unilateral primary osteoarthritis, right knee: Secondary | ICD-10-CM | POA: Diagnosis not present

## 2021-08-12 DIAGNOSIS — M1711 Unilateral primary osteoarthritis, right knee: Secondary | ICD-10-CM | POA: Diagnosis not present

## 2022-01-27 DIAGNOSIS — M1711 Unilateral primary osteoarthritis, right knee: Secondary | ICD-10-CM | POA: Diagnosis not present

## 2022-02-11 ENCOUNTER — Other Ambulatory Visit: Payer: Federal, State, Local not specified - PPO

## 2022-02-11 DIAGNOSIS — J9601 Acute respiratory failure with hypoxia: Secondary | ICD-10-CM

## 2022-02-11 DIAGNOSIS — Z13220 Encounter for screening for lipoid disorders: Secondary | ICD-10-CM | POA: Diagnosis not present

## 2022-02-11 DIAGNOSIS — K219 Gastro-esophageal reflux disease without esophagitis: Secondary | ICD-10-CM

## 2022-02-11 LAB — COMPLETE METABOLIC PANEL WITH GFR
AG Ratio: 1.7 (calc) (ref 1.0–2.5)
ALT: 44 U/L (ref 9–46)
AST: 27 U/L (ref 10–40)
Albumin: 4.7 g/dL (ref 3.6–5.1)
Alkaline phosphatase (APISO): 50 U/L (ref 36–130)
BUN: 15 mg/dL (ref 7–25)
CO2: 27 mmol/L (ref 20–32)
Calcium: 9.5 mg/dL (ref 8.6–10.3)
Chloride: 103 mmol/L (ref 98–110)
Creat: 0.9 mg/dL (ref 0.60–1.29)
Globulin: 2.7 g/dL (calc) (ref 1.9–3.7)
Glucose, Bld: 109 mg/dL — ABNORMAL HIGH (ref 65–99)
Potassium: 4.1 mmol/L (ref 3.5–5.3)
Sodium: 138 mmol/L (ref 135–146)
Total Bilirubin: 0.7 mg/dL (ref 0.2–1.2)
Total Protein: 7.4 g/dL (ref 6.1–8.1)
eGFR: 105 mL/min/{1.73_m2} (ref >=60)

## 2022-02-11 LAB — CBC WITH DIFFERENTIAL/PLATELET
Absolute Monocytes: 589 cells/uL (ref 200–950)
Basophils Absolute: 37 cells/uL (ref 0–200)
Basophils Relative: 0.6 %
Eosinophils Absolute: 267 cells/uL (ref 15–500)
Eosinophils Relative: 4.3 %
HCT: 43.8 % (ref 38.5–50.0)
Hemoglobin: 15.4 g/dL (ref 13.2–17.1)
Lymphs Abs: 1773 cells/uL (ref 850–3900)
MCH: 31.4 pg (ref 27.0–33.0)
MCHC: 35.2 g/dL (ref 32.0–36.0)
MCV: 89.4 fL (ref 80.0–100.0)
MPV: 11.5 fL (ref 7.5–12.5)
Monocytes Relative: 9.5 %
Neutro Abs: 3534 cells/uL (ref 1500–7800)
Neutrophils Relative %: 57 %
Platelets: 217 10*3/uL (ref 140–400)
RBC: 4.9 10*6/uL (ref 4.20–5.80)
RDW: 12.1 % (ref 11.0–15.0)
Total Lymphocyte: 28.6 %
WBC: 6.2 10*3/uL (ref 3.8–10.8)

## 2022-02-11 LAB — LIPID PANEL
Cholesterol: 203 mg/dL — ABNORMAL HIGH (ref <200)
HDL: 54 mg/dL (ref >=40)
LDL Cholesterol (Calc): 126 mg/dL (calc) — ABNORMAL HIGH
Non-HDL Cholesterol (Calc): 149 mg/dL (calc) — ABNORMAL HIGH (ref <130)
Total CHOL/HDL Ratio: 3.8 (calc) (ref <5.0)
Triglycerides: 115 mg/dL (ref <150)

## 2022-02-15 ENCOUNTER — Encounter: Payer: Federal, State, Local not specified - PPO | Admitting: Family Medicine

## 2022-02-22 ENCOUNTER — Other Ambulatory Visit: Payer: Self-pay | Admitting: Family Medicine

## 2022-02-25 ENCOUNTER — Ambulatory Visit (INDEPENDENT_AMBULATORY_CARE_PROVIDER_SITE_OTHER): Payer: Federal, State, Local not specified - PPO | Admitting: Family Medicine

## 2022-02-25 ENCOUNTER — Encounter: Payer: Self-pay | Admitting: Family Medicine

## 2022-02-25 VITALS — BP 146/90 | HR 80 | Ht 72.0 in | Wt 292.0 lb

## 2022-02-25 DIAGNOSIS — Z1211 Encounter for screening for malignant neoplasm of colon: Secondary | ICD-10-CM | POA: Diagnosis not present

## 2022-02-25 DIAGNOSIS — M25561 Pain in right knee: Secondary | ICD-10-CM

## 2022-02-25 DIAGNOSIS — Z0001 Encounter for general adult medical examination with abnormal findings: Secondary | ICD-10-CM | POA: Diagnosis not present

## 2022-02-25 DIAGNOSIS — L989 Disorder of the skin and subcutaneous tissue, unspecified: Secondary | ICD-10-CM | POA: Diagnosis not present

## 2022-02-25 DIAGNOSIS — Z0000 Encounter for general adult medical examination without abnormal findings: Secondary | ICD-10-CM

## 2022-02-25 DIAGNOSIS — D485 Neoplasm of uncertain behavior of skin: Secondary | ICD-10-CM | POA: Diagnosis not present

## 2022-02-25 MED ORDER — OMEPRAZOLE 40 MG PO CPDR: DELAYED_RELEASE_CAPSULE | ORAL | 3 refills | Status: AC

## 2022-02-25 MED ORDER — HYDROCODONE-ACETAMINOPHEN 10-325 MG PO TABS: 1.0000 | ORAL_TABLET | Freq: Four times a day (QID) | ORAL | 0 refills | Status: AC | PRN

## 2022-02-25 NOTE — Addendum Note (Signed)
Addended by: Jenna Luo T on: 02/25/2022 09:11 AM   Modules accepted: Orders

## 2022-02-25 NOTE — Progress Notes (Signed)
Subjective:    Patient ID: Gregory Lyons, male    DOB: 1975/04/28, 47 y.o.   MRN: [id number]  HPI Patient presents today for complete physical exam.  He is due for colon cancer screening.  He also has a suspicious mole on the lower back roughly around the level of L1 just to the left of the spine.  It is about 5 mm in diameter with a dark brown blackish center.  He would like this biopsy.  Otherwise he is doing well.  He is complaining of right knee pain and is about to have a knee replacement.  He would like me to refill his hydrocodone which he uses sparingly for severe knee pain as he is trying to make it until the surgery.  Otherwise he is doing well. Lab on 02/11/2022  Component Date Value Ref Range Status   WBC 02/11/2022 6.2  3.8 - 10.8 Thousand/uL Final   RBC 02/11/2022 4.90  4.20 - 5.80 Million/uL Final   Hemoglobin 02/11/2022 15.4  13.2 - 17.1 g/dL Final   HCT 40/98/1191 43.8  38.5 - 50.0 % Final   MCV 02/11/2022 89.4  80.0 - 100.0 fL Final   MCH 02/11/2022 31.4  27.0 - 33.0 pg Final   MCHC 02/11/2022 35.2  32.0 - 36.0 g/dL Final   RDW 47/82/9562 12.1  11.0 - 15.0 % Final   Platelets 02/11/2022 217  140 - 400 Thousand/uL Final   MPV 02/11/2022 11.5  7.5 - 12.5 fL Final   Neutro Abs 02/11/2022 3,534  1,500 - 7,800 cells/uL Final   Lymphs Abs 02/11/2022 1,773  850 - 3,900 cells/uL Final   Absolute Monocytes 02/11/2022 589  200 - 950 cells/uL Final   Eosinophils Absolute 02/11/2022 267  15 - 500 cells/uL Final   Basophils Absolute 02/11/2022 37  0 - 200 cells/uL Final   Neutrophils Relative % 02/11/2022 57  % Final   Total Lymphocyte 02/11/2022 28.6  % Final   Monocytes Relative 02/11/2022 9.5  % Final   Eosinophils Relative 02/11/2022 4.3  % Final   Basophils Relative 02/11/2022 0.6  % Final   Glucose, Bld 02/11/2022 109 (H)  65 - 99 mg/dL Final   Comment: .            Fasting reference interval . For someone without known diabetes, a glucose value between 100 and 125  mg/dL is consistent with prediabetes and should be confirmed with a follow-up test. .    BUN 02/11/2022 15  7 - 25 mg/dL Final   Creat 13/09/6576 0.90  0.60 - 1.29 mg/dL Final   eGFR 46/96/2952 105  > OR = 60 mL/min/1.60m2 Final   BUN/Creatinine Ratio 02/11/2022 SEE NOTE:  6 - 22 (calc) Final   Comment:    Not Reported: BUN and Creatinine are within    reference range. .    Sodium 02/11/2022 138  135 - 146 mmol/L Final   Potassium 02/11/2022 4.1  3.5 - 5.3 mmol/L Final   Chloride 02/11/2022 103  98 - 110 mmol/L Final   CO2 02/11/2022 27  20 - 32 mmol/L Final   Calcium 02/11/2022 9.5  8.6 - 10.3 mg/dL Final   Total Protein 84/13/2440 7.4  6.1 - 8.1 g/dL Final   Albumin 12/11/2534 4.7  3.6 - 5.1 g/dL Final   Globulin 64/40/3474 2.7  1.9 - 3.7 g/dL (calc) Final   AG Ratio 02/11/2022 1.7  1.0 - 2.5 (calc) Final   Total Bilirubin 02/11/2022 0.7  0.2 - 1.2 mg/dL Final   Alkaline phosphatase (APISO) 02/11/2022 50  36 - 130 U/L Final   AST 02/11/2022 27  10 - 40 U/L Final   ALT 02/11/2022 44  9 - 46 U/L Final   Cholesterol 02/11/2022 203 (H)  <200 mg/dL Final   HDL 64/40/3474 54  > OR = 40 mg/dL Final   Triglycerides 25/95/6387 115  <150 mg/dL Final   LDL Cholesterol (Calc) 02/11/2022 126 (H)  mg/dL (calc) Final   Comment: Reference range: <100 . Desirable range <100 mg/dL for primary prevention;   <70 mg/dL for patients with CHD or diabetic patients  with > or = 2 CHD risk factors. Marland Kitchen LDL-C is now calculated using the Martin-Hopkins  calculation, which is a validated novel method providing  better accuracy than the Friedewald equation in the  estimation of LDL-C.  Horald Pollen et al. Lenox Ahr. 5643;329(51): 2061-2068  (http://education.[facility].com/faq/FAQ164)    Total CHOL/HDL Ratio 02/11/2022 3.8  <8.8 (calc) Final   Non-HDL Cholesterol (Calc) 02/11/2022 149 (H)  <130 mg/dL (calc) Final   Comment: For patients with diabetes plus 1 major ASCVD risk  factor, treating to a  non-HDL-C goal of <100 mg/dL  (LDL-C of <41 mg/dL) is considered a therapeutic  option.    Past Medical History:  Diagnosis Date   Complication of anesthesia    pt states gets mean    GERD without esophagitis    Sleep apnea    pt states does not use CPAP pt scored 5 on stop bang tool results sent to PCP    Past Surgical History:  Procedure Laterality Date   ESOPHAGEAL DILATION N/A 05/19/2016   Procedure: ESOPHAGEAL DILATION;  Surgeon: West Bali, MD;  Location: [facility];  Service: Endoscopy;  Laterality: N/A;   ESOPHAGOGASTRODUODENOSCOPY N/A 05/19/2016   Procedure: ESOPHAGOGASTRODUODENOSCOPY (EGD);  Surgeon: West Bali, MD;  Location: [facility];  Service: Endoscopy;  Laterality: N/A;   KNEE ARTHROSCOPY Right 03/12/2014   Procedure: ARTHROSCOPY RIGHT KNEE WITH MENISCAL DEBRIDEMENT condroplasty;  Surgeon: Loanne Drilling, MD;  Location: [facility];  Service: Orthopedics;  Laterality: Right;   right knee arthroscopy     12 to 13 years ago    TENDON REPAIR     left hand 2007    Current Outpatient Medications on File Prior to Visit  Medication Sig Dispense Refill   azithromycin (ZITHROMAX) 250 MG tablet 2 tabs poqday1, 1 tab poqday 2-5 6 tablet 0   Ipratropium-Albuterol (COMBIVENT) 20-100 MCG/ACT AERS respimat Inhale 1 puff into the lungs every 6 (six) hours as needed for wheezing or shortness of breath. 4 g 0   meloxicam (MOBIC) 15 MG tablet Take 1 tablet (15 mg total) by mouth daily. 90 tablet 2   omeprazole (PRILOSEC) 40 MG capsule TAKE 1 CAPSULE(40 MG) BY MOUTH TWICE DAILY 180 capsule 1   No current facility-administered medications on file prior to visit.   No Known Allergies Social History   Socioeconomic History   Marital status: Married    Spouse name: Not on file   Number of children: Not on file   Years of education: Not on file   Highest education level: Not on file  Occupational History   Not on file  Tobacco Use   Smoking status: Former    Packs/day:  1.00    Years: 30.00    Total pack years: 30.00    Types: Cigarettes    Quit date: 02/15/2008    Years since quitting:  14.0   Smokeless tobacco: Former    Types: Chew  Substance and Sexual Activity   Alcohol use: Yes    Comment: occas beer    Drug use: No   Sexual activity: Not on file  Other Topics Concern   Not on file  Social History Narrative   Not on file   Social Determinants of Health   Financial Resource Strain: Not on file  Food Insecurity: Not on file  Transportation Needs: Not on file  Physical Activity: Not on file  Stress: Not on file  Social Connections: Not on file  Intimate Partner Violence: Not on file   No family history on file.' Dad has atrial fibrillation.  There is also remote family history of diabetes   Review of Systems  All other systems reviewed and are negative.      Objective:   Physical Exam Vitals reviewed.  Constitutional:      General: He is not in acute distress.    Appearance: He is obese. He is not ill-appearing, toxic-appearing or diaphoretic.  HENT:     Head: Normocephalic and atraumatic.     Right Ear: Tympanic membrane and ear canal normal. There is no impacted cerumen.     Left Ear: Tympanic membrane and ear canal normal. There is no impacted cerumen.     Nose: Nose normal. No congestion or rhinorrhea.     Mouth/Throat:     Mouth: Mucous membranes are moist.     Pharynx: Oropharynx is clear. No oropharyngeal exudate or posterior oropharyngeal erythema.  Eyes:     General: No scleral icterus.       Right eye: No discharge.        Left eye: No discharge.     Extraocular Movements: Extraocular movements intact.     Conjunctiva/sclera: Conjunctivae normal.     Pupils: Pupils are equal, round, and reactive to light.  Neck:     Vascular: No carotid bruit.  Cardiovascular:     Rate and Rhythm: Normal rate and regular rhythm.     Pulses: Normal pulses.     Heart sounds: Normal heart sounds. No murmur heard.    No friction  rub. No gallop.  Pulmonary:     Effort: Pulmonary effort is normal. No respiratory distress.     Breath sounds: Normal breath sounds. No stridor. No wheezing, rhonchi or rales.  Chest:     Chest wall: No tenderness.  Abdominal:     General: Abdomen is flat. Bowel sounds are normal. There is no distension.     Palpations: Abdomen is soft. There is no mass.     Tenderness: There is no abdominal tenderness. There is no right CVA tenderness, left CVA tenderness, guarding or rebound.     Hernia: No hernia is present.  Musculoskeletal:        General: No deformity.     Cervical back: Normal range of motion and neck supple. No rigidity. No muscular tenderness.     Right lower leg: No edema.     Left lower leg: No edema.  Lymphadenopathy:     Cervical: No cervical adenopathy.  Skin:    General: Skin is warm.     Coloration: Skin is not jaundiced.     Findings: No bruising, erythema, lesion or rash.  Neurological:     General: No focal deficit present.     Mental Status: He is alert and oriented to person, place, and time. Mental status is at baseline.  Cranial Nerves: No cranial nerve deficit.     Sensory: No sensory deficit.     Motor: No weakness.     Coordination: Coordination normal.     Gait: Gait normal.  Psychiatric:        Mood and Affect: Mood normal.        Behavior: Behavior normal.        Thought Content: Thought content normal.        Judgment: Judgment normal.    Mole as described in the history of present illness in the lower left of his back roughly around the level of L1       Assessment & Plan:  Colon cancer screening - Plan: Cologuard  Skin lesion of back - Plan: Pathology Report ([facility])  Acute pain of right knee  General medical exam Cholesterol is borderline elevated.  Blood pressure is elevated.  Patient believes his blood pressure is elevated due to rushing this morning.  He would like his wife to check it several times at home and they can notify  me of the values.  I recommended a low carbohydrate diet.  I recommended taking a statin given the high blood pressure and elevated blood sugar.  Patient is hesitant.  We also discussed cardiac CT score to evaluate for the presence of plaque in his coronary arteries as a way to help determine if he needs statin.  He will discuss this with his wife.  I performed a shave biopsy using sterile technique with local anesthesia of the lesion on his lower back and this was sent to pathology and labeled container.  Screen for colon cancer with Cologuard.

## 2022-03-02 LAB — PATHOLOGY REPORT

## 2022-03-02 LAB — TISSUE SPECIMEN

## 2022-03-07 ENCOUNTER — Other Ambulatory Visit: Payer: Federal, State, Local not specified - PPO

## 2022-03-09 ENCOUNTER — Telehealth: Payer: Self-pay

## 2022-03-09 NOTE — Telephone Encounter (Signed)
PA FOR OXYCODONE: Information regarding your request Your PA has been resolved, no additional PA is required. For further inquiries please contact the number on the back of the member prescription card. (Message [phone])

## 2022-03-11 ENCOUNTER — Encounter: Payer: Federal, State, Local not specified - PPO | Admitting: Family Medicine

## 2022-04-11 DIAGNOSIS — M25661 Stiffness of right knee, not elsewhere classified: Secondary | ICD-10-CM | POA: Diagnosis not present

## 2022-04-11 DIAGNOSIS — M1711 Unilateral primary osteoarthritis, right knee: Secondary | ICD-10-CM | POA: Diagnosis not present

## 2022-04-11 DIAGNOSIS — M25561 Pain in right knee: Secondary | ICD-10-CM | POA: Diagnosis not present

## 2022-04-25 DIAGNOSIS — Z0189 Encounter for other specified special examinations: Secondary | ICD-10-CM | POA: Diagnosis not present

## 2022-05-10 DIAGNOSIS — M1711 Unilateral primary osteoarthritis, right knee: Secondary | ICD-10-CM | POA: Diagnosis not present

## 2022-05-10 DIAGNOSIS — G8918 Other acute postprocedural pain: Secondary | ICD-10-CM | POA: Diagnosis not present

## 2022-05-12 DIAGNOSIS — M25661 Stiffness of right knee, not elsewhere classified: Secondary | ICD-10-CM | POA: Diagnosis not present

## 2022-05-12 DIAGNOSIS — M25561 Pain in right knee: Secondary | ICD-10-CM | POA: Diagnosis not present

## 2022-05-16 DIAGNOSIS — M25561 Pain in right knee: Secondary | ICD-10-CM | POA: Diagnosis not present

## 2022-05-16 DIAGNOSIS — M25661 Stiffness of right knee, not elsewhere classified: Secondary | ICD-10-CM | POA: Diagnosis not present

## 2022-05-18 DIAGNOSIS — M25661 Stiffness of right knee, not elsewhere classified: Secondary | ICD-10-CM | POA: Diagnosis not present

## 2022-05-18 DIAGNOSIS — M25561 Pain in right knee: Secondary | ICD-10-CM | POA: Diagnosis not present

## 2022-05-20 DIAGNOSIS — M25561 Pain in right knee: Secondary | ICD-10-CM | POA: Diagnosis not present

## 2022-05-20 DIAGNOSIS — M25661 Stiffness of right knee, not elsewhere classified: Secondary | ICD-10-CM | POA: Diagnosis not present

## 2022-05-23 DIAGNOSIS — M25661 Stiffness of right knee, not elsewhere classified: Secondary | ICD-10-CM | POA: Diagnosis not present

## 2022-05-23 DIAGNOSIS — M25561 Pain in right knee: Secondary | ICD-10-CM | POA: Diagnosis not present

## 2022-05-25 DIAGNOSIS — M25561 Pain in right knee: Secondary | ICD-10-CM | POA: Diagnosis not present

## 2022-05-25 DIAGNOSIS — M25661 Stiffness of right knee, not elsewhere classified: Secondary | ICD-10-CM | POA: Diagnosis not present

## 2022-05-27 DIAGNOSIS — M25561 Pain in right knee: Secondary | ICD-10-CM | POA: Diagnosis not present

## 2022-05-27 DIAGNOSIS — M25661 Stiffness of right knee, not elsewhere classified: Secondary | ICD-10-CM | POA: Diagnosis not present

## 2022-05-30 DIAGNOSIS — M25661 Stiffness of right knee, not elsewhere classified: Secondary | ICD-10-CM | POA: Diagnosis not present

## 2022-05-30 DIAGNOSIS — M25561 Pain in right knee: Secondary | ICD-10-CM | POA: Diagnosis not present

## 2022-06-01 DIAGNOSIS — M25661 Stiffness of right knee, not elsewhere classified: Secondary | ICD-10-CM | POA: Diagnosis not present

## 2022-06-01 DIAGNOSIS — M25561 Pain in right knee: Secondary | ICD-10-CM | POA: Diagnosis not present

## 2022-06-03 DIAGNOSIS — M25661 Stiffness of right knee, not elsewhere classified: Secondary | ICD-10-CM | POA: Diagnosis not present

## 2022-06-03 DIAGNOSIS — M25561 Pain in right knee: Secondary | ICD-10-CM | POA: Diagnosis not present

## 2022-06-06 DIAGNOSIS — M25661 Stiffness of right knee, not elsewhere classified: Secondary | ICD-10-CM | POA: Diagnosis not present

## 2022-06-06 DIAGNOSIS — M25561 Pain in right knee: Secondary | ICD-10-CM | POA: Diagnosis not present

## 2022-06-08 DIAGNOSIS — M25661 Stiffness of right knee, not elsewhere classified: Secondary | ICD-10-CM | POA: Diagnosis not present

## 2022-06-08 DIAGNOSIS — M25561 Pain in right knee: Secondary | ICD-10-CM | POA: Diagnosis not present

## 2022-06-10 DIAGNOSIS — M25661 Stiffness of right knee, not elsewhere classified: Secondary | ICD-10-CM | POA: Diagnosis not present

## 2022-06-10 DIAGNOSIS — M25561 Pain in right knee: Secondary | ICD-10-CM | POA: Diagnosis not present

## 2022-06-13 DIAGNOSIS — M25661 Stiffness of right knee, not elsewhere classified: Secondary | ICD-10-CM | POA: Diagnosis not present

## 2022-06-13 DIAGNOSIS — M25561 Pain in right knee: Secondary | ICD-10-CM | POA: Diagnosis not present

## 2022-06-15 DIAGNOSIS — M25561 Pain in right knee: Secondary | ICD-10-CM | POA: Diagnosis not present

## 2022-06-15 DIAGNOSIS — M25661 Stiffness of right knee, not elsewhere classified: Secondary | ICD-10-CM | POA: Diagnosis not present

## 2022-06-17 DIAGNOSIS — Z5189 Encounter for other specified aftercare: Secondary | ICD-10-CM | POA: Diagnosis not present

## 2022-06-23 DIAGNOSIS — M25561 Pain in right knee: Secondary | ICD-10-CM | POA: Diagnosis not present

## 2022-06-23 DIAGNOSIS — M25661 Stiffness of right knee, not elsewhere classified: Secondary | ICD-10-CM | POA: Diagnosis not present

## 2022-06-28 DIAGNOSIS — M25561 Pain in right knee: Secondary | ICD-10-CM | POA: Diagnosis not present

## 2022-06-28 DIAGNOSIS — M25661 Stiffness of right knee, not elsewhere classified: Secondary | ICD-10-CM | POA: Diagnosis not present

## 2022-06-30 DIAGNOSIS — M25561 Pain in right knee: Secondary | ICD-10-CM | POA: Diagnosis not present

## 2022-06-30 DIAGNOSIS — M25661 Stiffness of right knee, not elsewhere classified: Secondary | ICD-10-CM | POA: Diagnosis not present

## 2022-07-05 DIAGNOSIS — M25561 Pain in right knee: Secondary | ICD-10-CM | POA: Diagnosis not present

## 2022-07-05 DIAGNOSIS — M25661 Stiffness of right knee, not elsewhere classified: Secondary | ICD-10-CM | POA: Diagnosis not present

## 2022-07-07 DIAGNOSIS — M25561 Pain in right knee: Secondary | ICD-10-CM | POA: Diagnosis not present

## 2022-07-07 DIAGNOSIS — M25661 Stiffness of right knee, not elsewhere classified: Secondary | ICD-10-CM | POA: Diagnosis not present

## 2022-07-20 DIAGNOSIS — M25561 Pain in right knee: Secondary | ICD-10-CM | POA: Diagnosis not present

## 2022-07-20 DIAGNOSIS — M25661 Stiffness of right knee, not elsewhere classified: Secondary | ICD-10-CM | POA: Diagnosis not present

## 2023-02-02 ENCOUNTER — Encounter: Payer: Self-pay | Admitting: Family Medicine

## 2023-02-02 ENCOUNTER — Ambulatory Visit: Payer: Federal, State, Local not specified - PPO | Admitting: Family Medicine

## 2023-02-02 VITALS — BP 136/76 | HR 81 | Temp 98.1°F | Ht <= 58 in | Wt 292.0 lb

## 2023-02-02 DIAGNOSIS — L600 Ingrowing nail: Secondary | ICD-10-CM | POA: Diagnosis not present

## 2023-02-02 DIAGNOSIS — L03031 Cellulitis of right toe: Secondary | ICD-10-CM | POA: Diagnosis not present

## 2023-02-02 MED ORDER — SULFAMETHOXAZOLE-TRIMETHOPRIM 800-160 MG PO TABS: 1.0000 | ORAL_TABLET | Freq: Two times a day (BID) | ORAL | 0 refills | Status: AC

## 2023-02-02 MED ORDER — TERBINAFINE HCL 250 MG PO TABS: 250.0000 mg | ORAL_TABLET | Freq: Every day | ORAL | 2 refills | Status: AC

## 2023-02-02 NOTE — Progress Notes (Signed)
Subjective:    Patient ID: Gregory Lyons, male    DOB: [birth year], 47 y.o.   MRN: [id number]  Toe Pain   The medial portion of the right second toenail is severely ingrown.  There is hot red skin on the toe from the MTP joint to the toenail.  There is a 4 mm erythematous friable mass with granulation tissue laying on top of the medial portion of the toenail.  It is tender and painful.  He appears to have developed secondary cellulitis over the end the toenail.  He also has onychomycosis on his great toes that he would like treated  Past Medical History:  Diagnosis Date   Complication of anesthesia    pt states gets mean    GERD without esophagitis    Sleep apnea    pt states does not use CPAP pt scored 5 on stop bang tool results sent to PCP    Past Surgical History:  Procedure Laterality Date   ESOPHAGEAL DILATION N/A 05/19/2016   Procedure: ESOPHAGEAL DILATION;  Surgeon: West Bali, MD;  Location: [facility];  Service: Endoscopy;  Laterality: N/A;   ESOPHAGOGASTRODUODENOSCOPY N/A 05/19/2016   Procedure: ESOPHAGOGASTRODUODENOSCOPY (EGD);  Surgeon: West Bali, MD;  Location: [facility];  Service: Endoscopy;  Laterality: N/A;   KNEE ARTHROSCOPY Right 03/12/2014   Procedure: ARTHROSCOPY RIGHT KNEE WITH MENISCAL DEBRIDEMENT condroplasty;  Surgeon: Loanne Drilling, MD;  Location: [facility];  Service: Orthopedics;  Laterality: Right;   right knee arthroscopy     12 to 13 years ago    TENDON REPAIR     left hand 2007    Current Outpatient Medications on File Prior to Visit  Medication Sig Dispense Refill   Ipratropium-Albuterol (COMBIVENT) 20-100 MCG/ACT AERS respimat Inhale 1 puff into the lungs every 6 (six) hours as needed for wheezing or shortness of breath. 4 g 0   meloxicam (MOBIC) 15 MG tablet Take 1 tablet (15 mg total) by mouth daily. 90 tablet 2   omeprazole (PRILOSEC) 40 MG capsule TAKE 1 CAPSULE(40 MG) BY MOUTH TWICE DAILY 180 capsule 3   No current  facility-administered medications on file prior to visit.   No Known Allergies Social History   Socioeconomic History   Marital status: Married    Spouse name: Not on file   Number of children: Not on file   Years of education: Not on file   Highest education level: Not on file  Occupational History   Not on file  Tobacco Use   Smoking status: Former    Current packs/day: 0.00    Average packs/day: 1 pack/day for 30.0 years (30.0 ttl pk-yrs)    Types: Cigarettes    Start date: 02/14/1978    Quit date: 02/15/2008    Years since quitting: 14.9   Smokeless tobacco: Former    Types: Chew  Substance and Sexual Activity   Alcohol use: Yes    Comment: occas beer    Drug use: No   Sexual activity: Not on file  Other Topics Concern   Not on file  Social History Narrative   Not on file   Social Drivers of Health   Financial Resource Strain: Not on file  Food Insecurity: Not on file  Transportation Needs: Not on file  Physical Activity: Not on file  Stress: Not on file  Social Connections: Not on file  Intimate Partner Violence: Not on file   History reviewed. No pertinent family history.' Dad has  atrial fibrillation.  There is also remote family history of diabetes   Review of Systems  All other systems reviewed and are negative.      Objective:   Physical Exam Vitals reviewed.  Constitutional:      General: He is not in acute distress.    Appearance: He is obese. He is not ill-appearing, toxic-appearing or diaphoretic.  HENT:     Head: Normocephalic and atraumatic.     Right Ear: Tympanic membrane and ear canal normal. There is no impacted cerumen.     Left Ear: Tympanic membrane and ear canal normal. There is no impacted cerumen.     Nose: Nose normal. No congestion or rhinorrhea.     Mouth/Throat:     Mouth: Mucous membranes are moist.     Pharynx: Oropharynx is clear. No oropharyngeal exudate or posterior oropharyngeal erythema.  Eyes:     General: No scleral  icterus.       Right eye: No discharge.        Left eye: No discharge.     Extraocular Movements: Extraocular movements intact.     Conjunctiva/sclera: Conjunctivae normal.     Pupils: Pupils are equal, round, and reactive to light.  Neck:     Vascular: No carotid bruit.  Cardiovascular:     Rate and Rhythm: Normal rate and regular rhythm.     Pulses: Normal pulses.     Heart sounds: Normal heart sounds. No murmur heard.    No friction rub. No gallop.  Pulmonary:     Effort: Pulmonary effort is normal. No respiratory distress.     Breath sounds: Normal breath sounds. No stridor. No wheezing, rhonchi or rales.  Chest:     Chest wall: No tenderness.  Abdominal:     General: Abdomen is flat. Bowel sounds are normal. There is no distension.     Palpations: Abdomen is soft. There is no mass.     Tenderness: There is no abdominal tenderness. There is no right CVA tenderness, left CVA tenderness, guarding or rebound.     Hernia: No hernia is present.  Musculoskeletal:        General: No deformity.     Cervical back: Normal range of motion and neck supple. No rigidity. No muscular tenderness.     Right lower leg: No edema.     Left lower leg: No edema.  Lymphadenopathy:     Cervical: No cervical adenopathy.  Skin:    General: Skin is warm.     Coloration: Skin is not jaundiced.     Findings: No bruising, erythema, lesion or rash.  Neurological:     General: No focal deficit present.     Mental Status: He is alert and oriented to person, place, and time. Mental status is at baseline.     Cranial Nerves: No cranial nerve deficit.     Sensory: No sensory deficit.     Motor: No weakness.     Coordination: Coordination normal.     Gait: Gait normal.  Psychiatric:        Mood and Affect: Mood normal.        Behavior: Behavior normal.        Thought Content: Thought content normal.        Judgment: Judgment normal.   Toe as described in the history of present illness.      Assessment & Plan:  Ingrown toenail of left foot  Cellulitis of toe of right foot I anesthetized the  toe using 0.1% lidocaine with a digital block.  I applied a tourniquet to the base of the right second toe.  I separated the ingrown portion of the toenail for underlying nail bed using a metal elevator.  I made a longitudinal cut in the toenail with a pair of scissors all the way back to the lunula.  I then removed the ingrown portion of the toenail with a pair of hemostats.  I applied a copious amount of Polysporin to the wound bed.  I then wrapped the toe in petroleum gauze and Coban.  There was minimal blood loss.  The tourniquet was then removed.  Start the patient on Bactrim double strength tablets twice daily for 7 days.  Recheck if no better in 48 hours or sooner if worsening.  The patient was given wound care instructions.  Later I will treat the onychomycosis with Lamisil 250 mg daily for 90 days.  Patient is instructed to check liver function test every 30 days.

## 2023-03-20 ENCOUNTER — Other Ambulatory Visit: Payer: Federal, State, Local not specified - PPO

## 2023-03-20 DIAGNOSIS — Z0000 Encounter for general adult medical examination without abnormal findings: Secondary | ICD-10-CM

## 2023-03-20 DIAGNOSIS — K219 Gastro-esophageal reflux disease without esophagitis: Secondary | ICD-10-CM | POA: Diagnosis not present

## 2023-03-20 DIAGNOSIS — J9601 Acute respiratory failure with hypoxia: Secondary | ICD-10-CM | POA: Diagnosis not present

## 2023-03-20 DIAGNOSIS — Z13220 Encounter for screening for lipoid disorders: Secondary | ICD-10-CM | POA: Diagnosis not present

## 2023-03-21 LAB — CBC WITH DIFFERENTIAL/PLATELET
Absolute Lymphocytes: 2227 {cells}/uL (ref 850–3900)
Absolute Monocytes: 870 {cells}/uL (ref 200–950)
Basophils Absolute: 17 {cells}/uL (ref 0–200)
Basophils Relative: 0.2 %
Eosinophils Absolute: 339 {cells}/uL (ref 15–500)
Eosinophils Relative: 3.9 %
HCT: 40.7 % (ref 38.5–50.0)
Hemoglobin: 13.6 g/dL (ref 13.2–17.1)
MCH: 30.9 pg (ref 27.0–33.0)
MCHC: 33.4 g/dL (ref 32.0–36.0)
MCV: 92.5 fL (ref 80.0–100.0)
MPV: 11.7 fL (ref 7.5–12.5)
Monocytes Relative: 10 %
Neutro Abs: 5246 {cells}/uL (ref 1500–7800)
Neutrophils Relative %: 60.3 %
Platelets: 208 10*3/uL (ref 140–400)
RBC: 4.4 10*6/uL (ref 4.20–5.80)
RDW: 12.4 % (ref 11.0–15.0)
Total Lymphocyte: 25.6 %
WBC: 8.7 10*3/uL (ref 3.8–10.8)

## 2023-03-21 LAB — COMPLETE METABOLIC PANEL WITH GFR
AG Ratio: 1.8 (calc) (ref 1.0–2.5)
ALT: 39 U/L (ref 9–46)
AST: 26 U/L (ref 10–35)
Albumin: 4.4 g/dL (ref 3.6–5.1)
Alkaline phosphatase (APISO): 52 U/L (ref 35–144)
BUN: 16 mg/dL (ref 7–25)
CO2: 31 mmol/L (ref 20–32)
Calcium: 9 mg/dL (ref 8.6–10.3)
Chloride: 104 mmol/L (ref 98–110)
Creat: 1.06 mg/dL (ref 0.70–1.30)
Globulin: 2.5 g/dL (ref 1.9–3.7)
Glucose, Bld: 73 mg/dL (ref 65–99)
Potassium: 4.1 mmol/L (ref 3.5–5.3)
Sodium: 142 mmol/L (ref 135–146)
Total Bilirubin: 0.6 mg/dL (ref 0.2–1.2)
Total Protein: 6.9 g/dL (ref 6.1–8.1)
eGFR: 85 mL/min/{1.73_m2} (ref >=60)

## 2023-03-21 LAB — LIPID PANEL
Cholesterol: 199 mg/dL (ref <200)
HDL: 45 mg/dL (ref >=40)
LDL Cholesterol (Calc): 122 mg/dL — ABNORMAL HIGH
Non-HDL Cholesterol (Calc): 154 mg/dL — ABNORMAL HIGH (ref <130)
Total CHOL/HDL Ratio: 4.4 (calc) (ref <5.0)
Triglycerides: 208 mg/dL — ABNORMAL HIGH (ref <150)

## 2023-04-25 ENCOUNTER — Other Ambulatory Visit

## 2023-04-25 DIAGNOSIS — Z79899 Other long term (current) drug therapy: Secondary | ICD-10-CM

## 2023-04-26 LAB — HEPATIC FUNCTION PANEL
AG Ratio: 1.7 (calc) (ref 1.0–2.5)
ALT: 38 U/L (ref 9–46)
AST: 24 U/L (ref 10–35)
Albumin: 4.6 g/dL (ref 3.6–5.1)
Alkaline phosphatase (APISO): 52 U/L (ref 35–144)
Bilirubin, Direct: 0.1 mg/dL (ref 0.0–0.2)
Globulin: 2.7 g/dL (ref 1.9–3.7)
Indirect Bilirubin: 0.4 mg/dL (ref 0.2–1.2)
Total Bilirubin: 0.5 mg/dL (ref 0.2–1.2)
Total Protein: 7.3 g/dL (ref 6.1–8.1)

## 2023-05-25 DIAGNOSIS — Z96651 Presence of right artificial knee joint: Secondary | ICD-10-CM | POA: Diagnosis not present

## 2023-05-25 DIAGNOSIS — Z471 Aftercare following joint replacement surgery: Secondary | ICD-10-CM | POA: Diagnosis not present

## 2023-08-24 IMAGING — MR MR KNEE*R* WO/W CM
5 of 9 series · 19 of 40 positions shown · IV contrast (multihance)
Comparison: X-ray 12/25/2020

CLINICAL DATA: Lateral right knee pain. History of knee surgery in
8643

EXAM:
MRI OF THE RIGHT KNEE WITHOUT AND WITH CONTRAST
TECHNIQUE: Multiplanar, multisequence MR imaging of the knee was performed
before and after the administration of intravenous contrast.
CONTRAST:  20mL MULTIHANCE GADOBENATE DIMEGLUMINE 529 MG/ML IV SOLN

[Series 3: T2 fat-sat · axial · 4.0mm · 0.29mm/px · z∈[-72,+77]mm · 4 of 35 slices shown (1 of 3)]
[im 1/35]
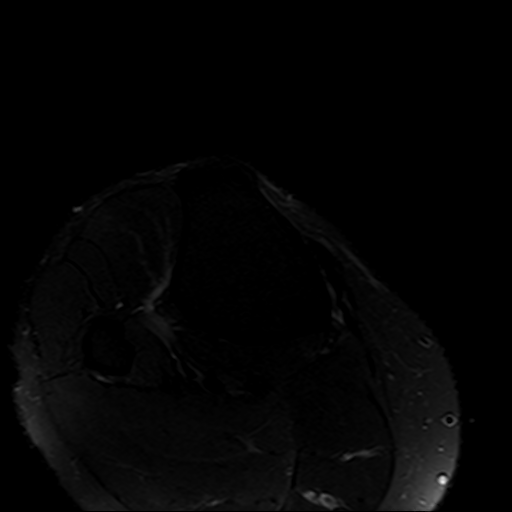
[im 12/35]
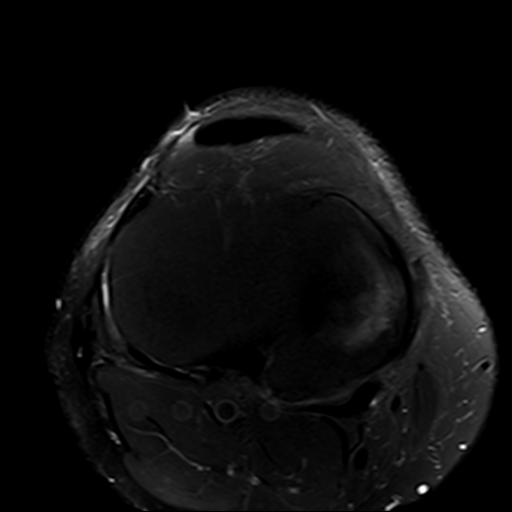
[im 23/35]
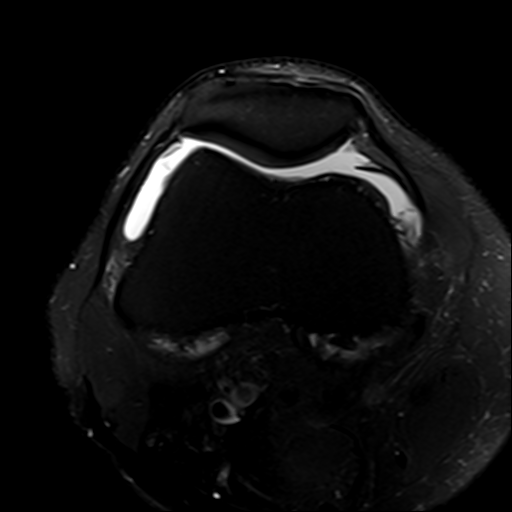
[im 35/35]
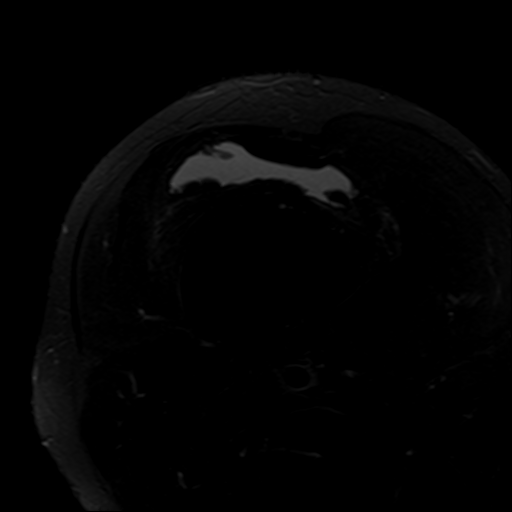

[Series 5: T2 fat-sat · coronal · 4.0mm · 0.31mm/px · 4 of 30 slices shown (2 of 3)]
[im 1/30]
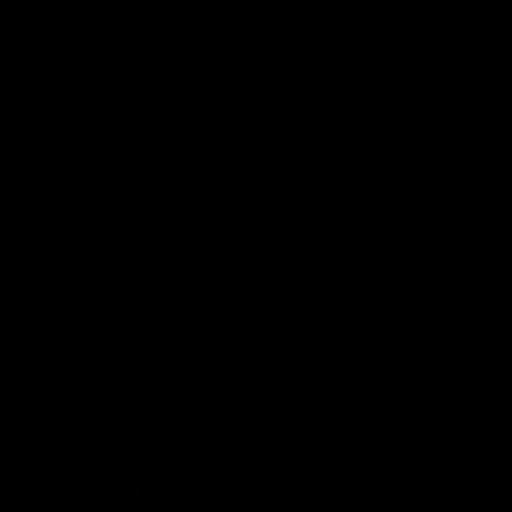
[im 10/30]
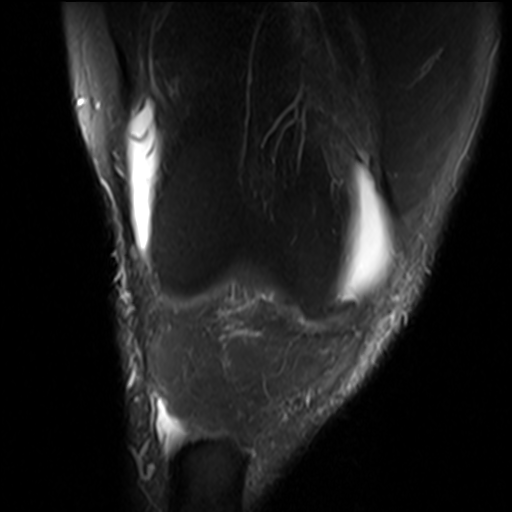
[im 20/30]
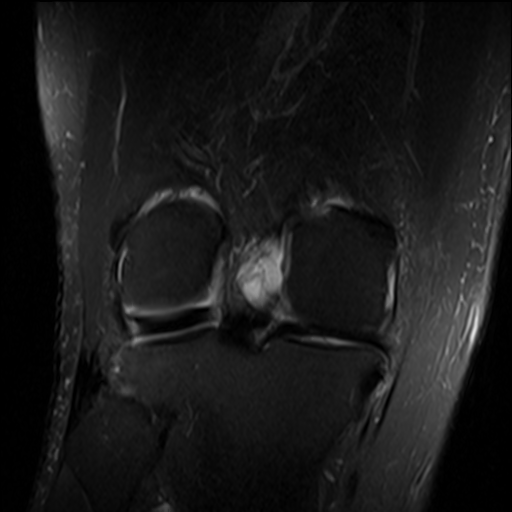
[im 30/30]
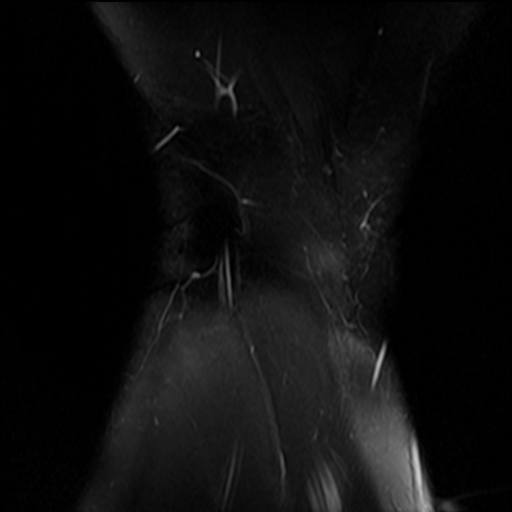

[Series 6: PD fat-sat · coronal · 4.0mm · 0.31mm/px · 4 of 30 slices shown (1 of 2)]
[im 1/30]
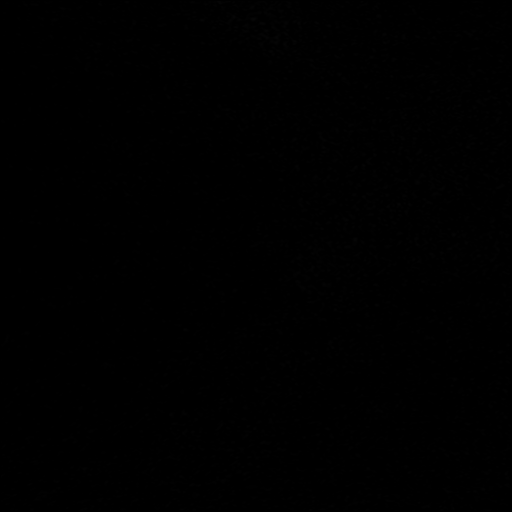
[im 10/30]
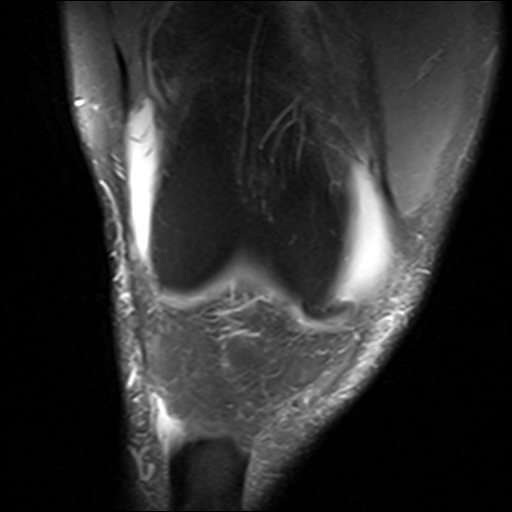
[im 20/30]
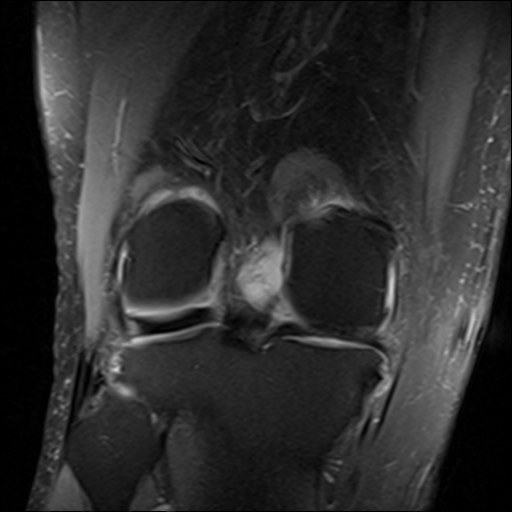
[im 30/30]
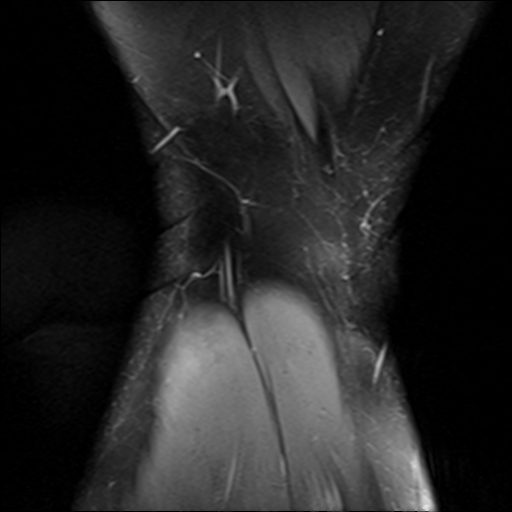

[Series 7: PD fat-sat · sagittal · 3.0mm · 0.31mm/px · 5 of 34 slices shown (2 of 2)]
[im 1/34]
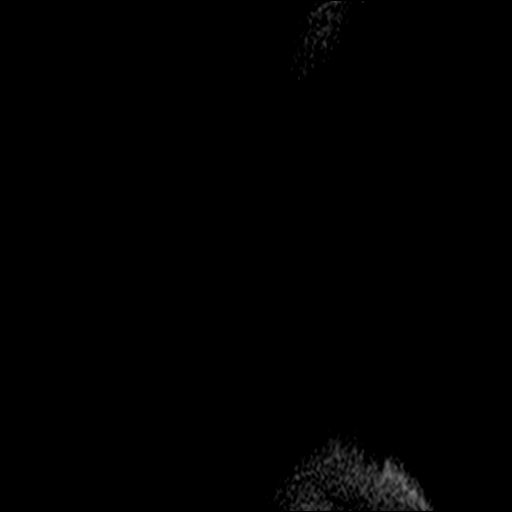
[im 9/34]
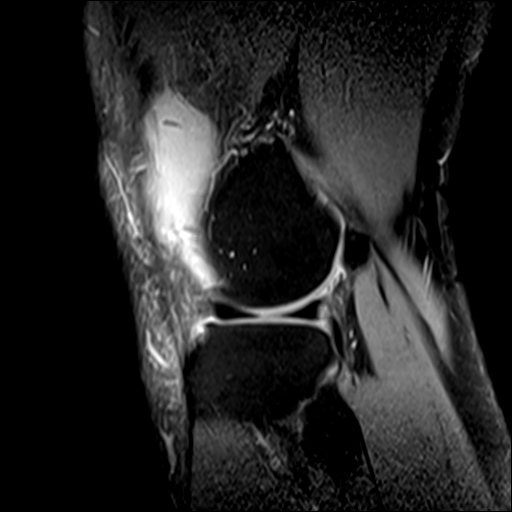
[im 17/34]
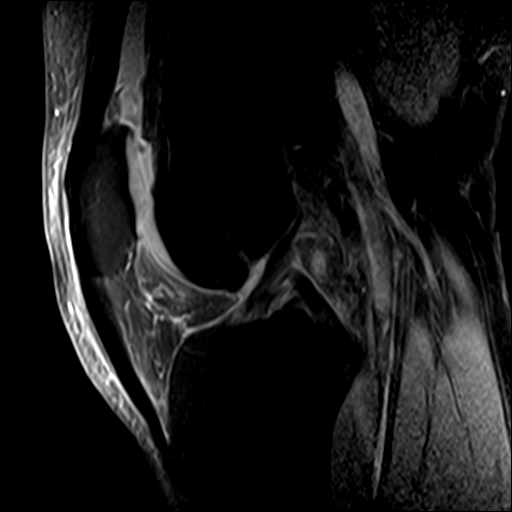
[im 25/34]
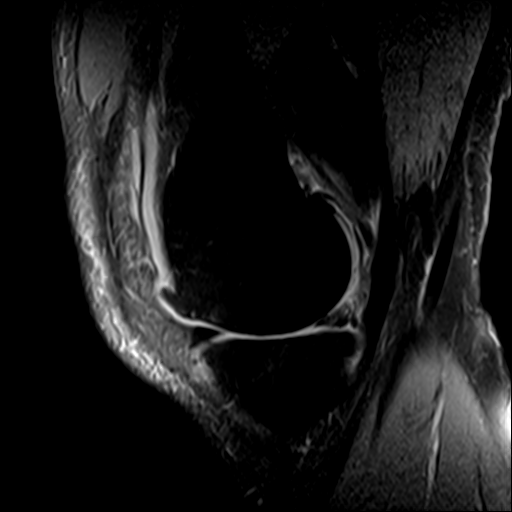
[im 34/34]
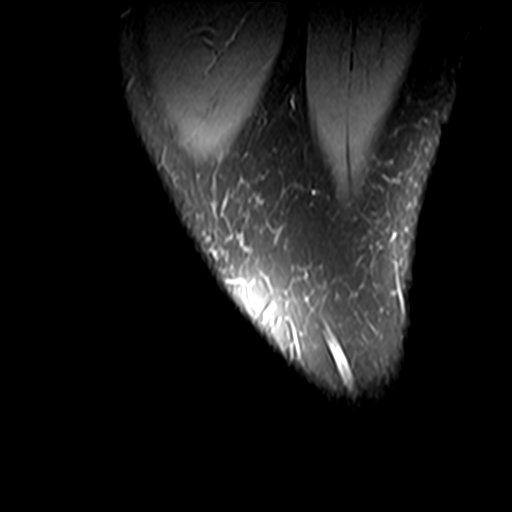

[Series 8: T2 fat-sat · sagittal · 3.0mm · 0.31mm/px · 2 of 34 slices shown (3 of 3)]
[im 1/34]
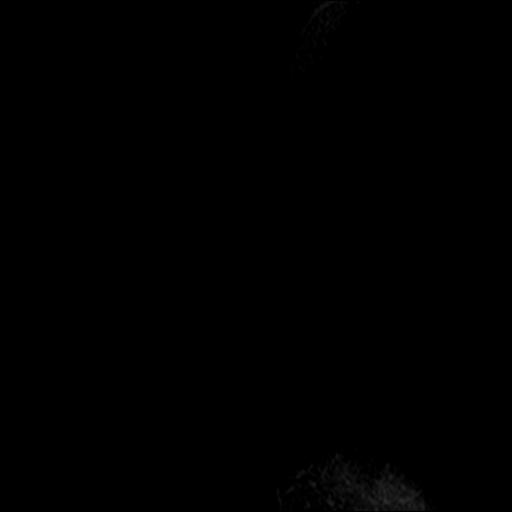
[im 9/34]
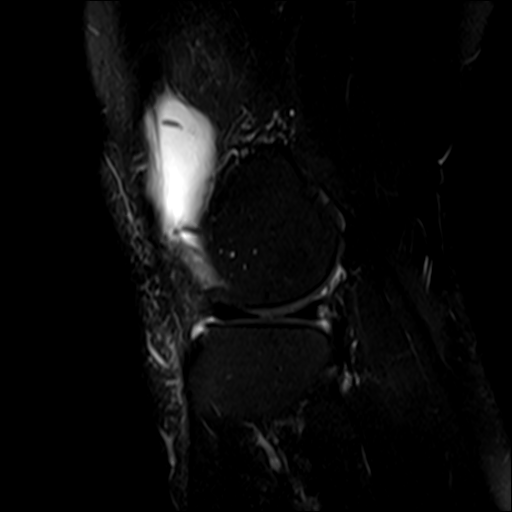

[19 of 40 positions shown; findings below may reference images not displayed]

FINDINGS: MENISCI

Medial meniscus: Medial meniscal body and posterior horn are
slightly diminutive with blunting of the free edge, which may be
related to prior partial meniscectomy changes. Subtle oblique
undersurface tear centered at the posterior horn-body junction
(series 7, images 25-26).

Lateral meniscus:  Intact.

LIGAMENTS

Cruciates:  Intact ACL and PCL.

Collaterals: Medial collateral ligament is intact. Lateral
collateral ligament complex is intact.

CARTILAGE

Patellofemoral:  No chondral defect.

Medial: High-grade cartilage loss of the weight-bearing surfaces of
the medial compartment.

Lateral:  No chondral defect.

Joint: Moderate knee joint effusion with mild enhancing synovitis.
Fat pads within normal limits.

Popliteal Fossa:  No Baker cyst. Intact popliteus tendon.

Extensor Mechanism:  Intact quadriceps tendon and patellar tendon.

Bones: Medial compartment joint space narrowing with marginal
osteophyte formation. Mild reactive subchondral marrow edema at the
periphery of the medial tibial plateau. No subchondral fracture. No
malalignment. No suspicious bone lesion.

Other: Normal muscle bulk and signal intensity. No solid or cystic
mass within the soft tissues. No abnormal extra-articular
postcontrast enhancement.
IMPRESSION: 1. Moderate-advanced medial compartment osteoarthritis.
2. Medial meniscal tear.
3. Moderate knee joint effusion with mild synovitis.

## 2023-12-20 DIAGNOSIS — R070 Pain in throat: Secondary | ICD-10-CM | POA: Diagnosis not present

## 2023-12-20 DIAGNOSIS — B349 Viral infection, unspecified: Secondary | ICD-10-CM | POA: Diagnosis not present
